# Patient Record
Sex: Female | Born: 1945 | Race: White | Hispanic: No | Marital: Married | Smoking: Never smoker
Health system: Southern US, Community
[De-identification: ages and names within clinical notes are randomized; demographics above are authoritative.]

## PROBLEM LIST (undated history)

## (undated) DIAGNOSIS — E119 Type 2 diabetes mellitus without complications: Secondary | ICD-10-CM

## (undated) DIAGNOSIS — K219 Gastro-esophageal reflux disease without esophagitis: Secondary | ICD-10-CM

## (undated) DIAGNOSIS — R609 Edema, unspecified: Secondary | ICD-10-CM

## (undated) DIAGNOSIS — M1711 Unilateral primary osteoarthritis, right knee: Secondary | ICD-10-CM

## (undated) DIAGNOSIS — M545 Low back pain: Secondary | ICD-10-CM

## (undated) DIAGNOSIS — E559 Vitamin D deficiency, unspecified: Secondary | ICD-10-CM

## (undated) DIAGNOSIS — M1990 Unspecified osteoarthritis, unspecified site: Secondary | ICD-10-CM

## (undated) DIAGNOSIS — F329 Major depressive disorder, single episode, unspecified: Secondary | ICD-10-CM

## (undated) DIAGNOSIS — E785 Hyperlipidemia, unspecified: Secondary | ICD-10-CM

## (undated) DIAGNOSIS — E0590 Thyrotoxicosis, unspecified without thyrotoxic crisis or storm: Secondary | ICD-10-CM

## (undated) DIAGNOSIS — G4700 Insomnia, unspecified: Secondary | ICD-10-CM

## (undated) DIAGNOSIS — F411 Generalized anxiety disorder: Secondary | ICD-10-CM

## (undated) DIAGNOSIS — I890 Lymphedema, not elsewhere classified: Secondary | ICD-10-CM

## (undated) DIAGNOSIS — M25569 Pain in unspecified knee: Secondary | ICD-10-CM

## (undated) DIAGNOSIS — I10 Essential (primary) hypertension: Secondary | ICD-10-CM

## (undated) DIAGNOSIS — I359 Nonrheumatic aortic valve disorder, unspecified: Secondary | ICD-10-CM

## (undated) HISTORY — DX: Morbid (severe) obesity due to excess calories: E66.01

## (undated) HISTORY — DX: Insomnia, unspecified: G47.00

## (undated) HISTORY — DX: Nonrheumatic aortic valve disorder, unspecified: I35.9

## (undated) HISTORY — DX: Gastro-esophageal reflux disease without esophagitis: K21.9

## (undated) HISTORY — DX: Low back pain: M54.5

## (undated) HISTORY — DX: Essential (primary) hypertension: I10

## (undated) HISTORY — PX: ABDOMINAL HYSTERECTOMY: SHX81

## (undated) HISTORY — DX: Lymphedema, not elsewhere classified: I89.0

## (undated) HISTORY — DX: Thyrotoxicosis, unspecified without thyrotoxic crisis or storm: E05.90

## (undated) HISTORY — DX: Major depressive disorder, single episode, unspecified: F32.9

## (undated) HISTORY — DX: Edema, unspecified: R60.9

## (undated) HISTORY — DX: Hyperlipidemia, unspecified: E78.5

## (undated) HISTORY — DX: Unilateral primary osteoarthritis, right knee: M17.11

## (undated) HISTORY — DX: Pain in unspecified knee: M25.569

## (undated) HISTORY — DX: Generalized anxiety disorder: F41.1

## (undated) HISTORY — PX: CHOLECYSTECTOMY: SHX55

## (undated) HISTORY — DX: Unspecified osteoarthritis, unspecified site: M19.90

## (undated) HISTORY — PX: OOPHORECTOMY: SHX86

## (undated) HISTORY — DX: Vitamin D deficiency, unspecified: E55.9

## (undated) HISTORY — DX: Type 2 diabetes mellitus without complications: E11.9

---

## 1999-07-08 LAB — HM MAMMOGRAPHY

## 2005-02-10 ENCOUNTER — Ambulatory Visit: Payer: Self-pay | Admitting: Internal Medicine

## 2005-02-11 ENCOUNTER — Ambulatory Visit: Payer: Self-pay | Admitting: Internal Medicine

## 2005-03-19 ENCOUNTER — Ambulatory Visit: Payer: Self-pay | Admitting: Internal Medicine

## 2005-11-09 ENCOUNTER — Ambulatory Visit: Payer: Self-pay | Admitting: Internal Medicine

## 2006-02-15 ENCOUNTER — Ambulatory Visit: Payer: Self-pay | Admitting: Internal Medicine

## 2007-03-17 ENCOUNTER — Ambulatory Visit: Payer: Self-pay | Admitting: Internal Medicine

## 2007-03-17 LAB — CONVERTED CEMR LAB
ALT: 14 units/L (ref 0–40)
AST: 18 units/L (ref 0–37)
Albumin: 3.8 g/dL (ref 3.5–5.2)
Alkaline Phosphatase: 80 units/L (ref 39–117)
BUN: 16 mg/dL (ref 6–23)
Basophils Absolute: 0.1 10*3/uL (ref 0.0–0.1)
Basophils Relative: 0.8 % (ref 0.0–1.0)
Bilirubin Urine: NEGATIVE
CO2: 30 meq/L (ref 19–32)
Chloride: 105 meq/L (ref 96–112)
Creatinine, Ser: 0.6 mg/dL (ref 0.4–1.2)
HCT: 37.6 % (ref 36.0–46.0)
HDL: 37.7 mg/dL — ABNORMAL LOW (ref >39.0)
Hemoglobin, Urine: NEGATIVE
Hemoglobin: 12.9 g/dL (ref 12.0–15.0)
Ketones, ur: 15 mg/dL — AB
LDL Cholesterol: 67 mg/dL (ref 0–99)
Leukocytes, UA: NEGATIVE
MCHC: 34.4 g/dL (ref 30.0–36.0)
Monocytes Relative: 5.8 % (ref 3.0–11.0)
RBC: 4.59 M/uL (ref 3.87–5.11)
RDW: 15.9 % — ABNORMAL HIGH (ref 11.5–14.6)
Specific Gravity, Urine: >=1.03 (ref 1.000–1.03)
TSH: 0.75 microintl units/mL (ref 0.35–5.50)
Total Bilirubin: 0.6 mg/dL (ref 0.3–1.2)
Total Protein, Urine: NEGATIVE mg/dL
Total Protein: 7.8 g/dL (ref 6.0–8.3)
Urobilinogen, UA: 2 (ref 0.0–1.0)
VLDL: 19 mg/dL (ref 0–40)
pH: 6 (ref 5.0–8.0)

## 2007-09-23 ENCOUNTER — Encounter: Payer: Self-pay | Admitting: Internal Medicine

## 2007-09-23 DIAGNOSIS — E785 Hyperlipidemia, unspecified: Secondary | ICD-10-CM

## 2007-09-23 DIAGNOSIS — I359 Nonrheumatic aortic valve disorder, unspecified: Secondary | ICD-10-CM

## 2007-09-23 DIAGNOSIS — I10 Essential (primary) hypertension: Secondary | ICD-10-CM

## 2007-09-23 DIAGNOSIS — K219 Gastro-esophageal reflux disease without esophagitis: Secondary | ICD-10-CM | POA: Insufficient documentation

## 2007-09-23 DIAGNOSIS — R609 Edema, unspecified: Secondary | ICD-10-CM | POA: Insufficient documentation

## 2007-09-23 DIAGNOSIS — F329 Major depressive disorder, single episode, unspecified: Secondary | ICD-10-CM

## 2007-09-23 DIAGNOSIS — F3289 Other specified depressive episodes: Secondary | ICD-10-CM | POA: Insufficient documentation

## 2007-09-23 DIAGNOSIS — M545 Low back pain, unspecified: Secondary | ICD-10-CM

## 2007-09-23 DIAGNOSIS — G4700 Insomnia, unspecified: Secondary | ICD-10-CM

## 2007-09-23 DIAGNOSIS — E0590 Thyrotoxicosis, unspecified without thyrotoxic crisis or storm: Secondary | ICD-10-CM

## 2007-09-23 DIAGNOSIS — F411 Generalized anxiety disorder: Secondary | ICD-10-CM | POA: Insufficient documentation

## 2007-09-23 DIAGNOSIS — E119 Type 2 diabetes mellitus without complications: Secondary | ICD-10-CM | POA: Insufficient documentation

## 2007-09-23 DIAGNOSIS — M1990 Unspecified osteoarthritis, unspecified site: Secondary | ICD-10-CM | POA: Insufficient documentation

## 2007-09-23 HISTORY — DX: Nonrheumatic aortic valve disorder, unspecified: I35.9

## 2007-09-23 HISTORY — DX: Gastro-esophageal reflux disease without esophagitis: K21.9

## 2007-09-23 HISTORY — DX: Low back pain, unspecified: M54.50

## 2007-09-23 HISTORY — DX: Insomnia, unspecified: G47.00

## 2007-09-23 HISTORY — DX: Generalized anxiety disorder: F41.1

## 2007-09-23 HISTORY — DX: Other specified depressive episodes: F32.89

## 2007-09-23 HISTORY — DX: Morbid (severe) obesity due to excess calories: E66.01

## 2007-09-23 HISTORY — DX: Edema, unspecified: R60.9

## 2007-09-23 HISTORY — DX: Hyperlipidemia, unspecified: E78.5

## 2007-09-23 HISTORY — DX: Type 2 diabetes mellitus without complications: E11.9

## 2007-09-23 HISTORY — DX: Unspecified osteoarthritis, unspecified site: M19.90

## 2007-09-23 HISTORY — DX: Major depressive disorder, single episode, unspecified: F32.9

## 2007-09-23 HISTORY — DX: Thyrotoxicosis, unspecified without thyrotoxic crisis or storm: E05.90

## 2007-09-23 HISTORY — DX: Essential (primary) hypertension: I10

## 2007-12-26 ENCOUNTER — Telehealth: Payer: Self-pay | Admitting: Internal Medicine

## 2008-03-14 ENCOUNTER — Ambulatory Visit: Payer: Self-pay | Admitting: Internal Medicine

## 2008-03-14 DIAGNOSIS — M25569 Pain in unspecified knee: Secondary | ICD-10-CM | POA: Insufficient documentation

## 2008-03-14 HISTORY — DX: Pain in unspecified knee: M25.569

## 2008-03-14 LAB — CONVERTED CEMR LAB
Albumin: 3.6 g/dL (ref 3.5–5.2)
BUN: 15 mg/dL (ref 6–23)
Basophils Relative: 0 % (ref 0.0–1.0)
Cholesterol: 139 mg/dL (ref 0–200)
Creatinine, Ser: 0.6 mg/dL (ref 0.4–1.2)
Creatinine,U: 58.4 mg/dL
Eosinophils Absolute: 0.1 10*3/uL (ref 0.0–0.7)
Eosinophils Relative: 1.2 % (ref 0.0–5.0)
GFR calc Af Amer: 131 mL/min
GFR calc non Af Amer: 108 mL/min
HCT: 37.1 % (ref 36.0–46.0)
HDL: 40.1 mg/dL (ref >39.0)
Hemoglobin, Urine: NEGATIVE
Hgb A1c MFr Bld: 6.6 % — ABNORMAL HIGH (ref 4.6–6.0)
Ketones, ur: NEGATIVE mg/dL
MCHC: 33.7 g/dL (ref 30.0–36.0)
MCV: 82.2 fL (ref 78.0–100.0)
Microalb Creat Ratio: 80.5 mg/g — ABNORMAL HIGH (ref 0.0–30.0)
Monocytes Absolute: 0.3 10*3/uL (ref 0.1–1.0)
Platelets: 184 10*3/uL (ref 150–400)
Potassium: 4.2 meq/L (ref 3.5–5.1)
Total Protein, Urine: NEGATIVE mg/dL
Triglycerides: 105 mg/dL (ref 0–149)
Urine Glucose: NEGATIVE mg/dL
VLDL: 21 mg/dL (ref 0–40)
WBC: 6.6 10*3/uL (ref 4.5–10.5)

## 2008-04-19 ENCOUNTER — Telehealth (INDEPENDENT_AMBULATORY_CARE_PROVIDER_SITE_OTHER): Payer: Self-pay | Admitting: *Deleted

## 2008-05-08 ENCOUNTER — Encounter: Payer: Self-pay | Admitting: Internal Medicine

## 2008-08-09 ENCOUNTER — Telehealth: Payer: Self-pay | Admitting: Internal Medicine

## 2008-12-03 ENCOUNTER — Encounter: Admission: RE | Admit: 2008-12-03 | Discharge: 2008-12-03 | Payer: Self-pay | Admitting: Orthopedic Surgery

## 2009-03-18 ENCOUNTER — Ambulatory Visit: Payer: Self-pay | Admitting: Internal Medicine

## 2009-03-19 LAB — CONVERTED CEMR LAB
ALT: 16 units/L (ref 0–35)
AST: 22 units/L (ref 0–37)
BUN: 15 mg/dL (ref 6–23)
Bilirubin Urine: NEGATIVE
Bilirubin, Direct: 0.1 mg/dL (ref 0.0–0.3)
Creatinine, Ser: 0.6 mg/dL (ref 0.4–1.2)
Eosinophils Relative: 1.5 % (ref 0.0–5.0)
GFR calc non Af Amer: 107.41 mL/min (ref >60)
Hemoglobin, Urine: NEGATIVE
Ketones, ur: NEGATIVE mg/dL
LDL Cholesterol: 79 mg/dL (ref 0–99)
Microalb, Ur: 1.3 mg/dL (ref 0.0–1.9)
Monocytes Absolute: 0.4 10*3/uL (ref 0.1–1.0)
Monocytes Relative: 5.6 % (ref 3.0–12.0)
Neutrophils Relative %: 74.9 % (ref 43.0–77.0)
Platelets: 162 10*3/uL (ref 150.0–400.0)
Total Bilirubin: 0.5 mg/dL (ref 0.3–1.2)
Total CHOL/HDL Ratio: 4
Triglycerides: 157 mg/dL — ABNORMAL HIGH (ref 0.0–149.0)
Urine Glucose: NEGATIVE mg/dL
Urobilinogen, UA: 0.2 (ref 0.0–1.0)
VLDL: 31.4 mg/dL (ref 0.0–40.0)
WBC: 6.3 10*3/uL (ref 4.5–10.5)

## 2009-03-20 LAB — CONVERTED CEMR LAB: Vit D, 25-Hydroxy: 5 ng/mL — ABNORMAL LOW (ref 30–89)

## 2009-03-27 ENCOUNTER — Telehealth (INDEPENDENT_AMBULATORY_CARE_PROVIDER_SITE_OTHER): Payer: Self-pay | Admitting: *Deleted

## 2009-11-03 ENCOUNTER — Telehealth: Payer: Self-pay | Admitting: Internal Medicine

## 2009-11-07 ENCOUNTER — Telehealth: Payer: Self-pay | Admitting: Internal Medicine

## 2009-12-30 ENCOUNTER — Encounter: Payer: Self-pay | Admitting: Internal Medicine

## 2010-03-04 ENCOUNTER — Telehealth: Payer: Self-pay | Admitting: Internal Medicine

## 2010-03-31 ENCOUNTER — Ambulatory Visit: Payer: Self-pay | Admitting: Internal Medicine

## 2010-03-31 DIAGNOSIS — E559 Vitamin D deficiency, unspecified: Secondary | ICD-10-CM

## 2010-03-31 HISTORY — DX: Vitamin D deficiency, unspecified: E55.9

## 2010-03-31 LAB — CONVERTED CEMR LAB
ALT: 14 units/L (ref 0–35)
Albumin: 3.5 g/dL (ref 3.5–5.2)
Basophils Relative: 0.2 % (ref 0.0–3.0)
CO2: 29 meq/L (ref 19–32)
Chloride: 105 meq/L (ref 96–112)
Creatinine,U: 46.1 mg/dL
Eosinophils Absolute: 0.1 10*3/uL (ref 0.0–0.7)
Eosinophils Relative: 0.8 % (ref 0.0–5.0)
HCT: 34.6 % — ABNORMAL LOW (ref 36.0–46.0)
HDL: 37.7 mg/dL — ABNORMAL LOW (ref >39.00)
Hemoglobin: 11.7 g/dL — ABNORMAL LOW (ref 12.0–15.0)
Hgb A1c MFr Bld: 7.1 % — ABNORMAL HIGH (ref 4.6–6.5)
Ketones, ur: NEGATIVE mg/dL
Leukocytes, UA: NEGATIVE
MCHC: 33.7 g/dL (ref 30.0–36.0)
MCV: 83.9 fL (ref 78.0–100.0)
Microalb, Ur: 4.4 mg/dL — ABNORMAL HIGH (ref 0.0–1.9)
Monocytes Absolute: 0.4 10*3/uL (ref 0.1–1.0)
Neutro Abs: 6 10*3/uL (ref 1.4–7.7)
Potassium: 4.4 meq/L (ref 3.5–5.1)
RBC: 4.13 M/uL (ref 3.87–5.11)
Sodium: 143 meq/L (ref 135–145)
Specific Gravity, Urine: 1.015 (ref 1.000–1.030)
TSH: 0.95 microintl units/mL (ref 0.35–5.50)
Total Protein: 7.1 g/dL (ref 6.0–8.3)
Triglycerides: 79 mg/dL (ref 0.0–149.0)
Urine Glucose: NEGATIVE mg/dL
Urobilinogen, UA: 0.2 (ref 0.0–1.0)
pH: 6.5 (ref 5.0–8.0)

## 2010-09-22 ENCOUNTER — Encounter: Payer: Self-pay | Admitting: Internal Medicine

## 2010-09-25 ENCOUNTER — Ambulatory Visit: Payer: Self-pay | Admitting: Internal Medicine

## 2010-09-30 ENCOUNTER — Encounter: Payer: Self-pay | Admitting: Internal Medicine

## 2010-10-12 ENCOUNTER — Encounter: Payer: Self-pay | Admitting: Internal Medicine

## 2010-10-13 ENCOUNTER — Encounter: Payer: Self-pay | Admitting: Internal Medicine

## 2010-10-14 ENCOUNTER — Encounter: Payer: Self-pay | Admitting: Internal Medicine

## 2010-10-19 ENCOUNTER — Encounter: Payer: Self-pay | Admitting: Internal Medicine

## 2010-10-20 ENCOUNTER — Encounter: Payer: Self-pay | Admitting: Internal Medicine

## 2010-10-27 ENCOUNTER — Telehealth: Payer: Self-pay | Admitting: Internal Medicine

## 2010-10-29 ENCOUNTER — Encounter: Payer: Self-pay | Admitting: Internal Medicine

## 2010-11-04 ENCOUNTER — Telehealth: Payer: Self-pay | Admitting: Internal Medicine

## 2010-11-04 ENCOUNTER — Ambulatory Visit: Payer: Self-pay | Admitting: Internal Medicine

## 2010-11-05 LAB — CONVERTED CEMR LAB
BUN: 27 mg/dL — ABNORMAL HIGH (ref 6–23)
CO2: 30 meq/L (ref 19–32)
Calcium: 9.3 mg/dL (ref 8.4–10.5)
Creatinine, Ser: 1.3 mg/dL — ABNORMAL HIGH (ref 0.4–1.2)
GFR calc non Af Amer: 42.64 mL/min — ABNORMAL LOW (ref >60.00)
Glucose, Bld: 179 mg/dL — ABNORMAL HIGH (ref 70–99)
LDL Cholesterol: 80 mg/dL (ref 0–99)
Sodium: 140 meq/L (ref 135–145)
Total CHOL/HDL Ratio: 5
Triglycerides: 126 mg/dL (ref 0.0–149.0)

## 2010-12-13 ENCOUNTER — Encounter: Payer: Self-pay | Admitting: Orthopedic Surgery

## 2010-12-22 ENCOUNTER — Encounter: Payer: Self-pay | Admitting: Internal Medicine

## 2010-12-22 DIAGNOSIS — E119 Type 2 diabetes mellitus without complications: Secondary | ICD-10-CM

## 2010-12-22 DIAGNOSIS — M1710 Unilateral primary osteoarthritis, unspecified knee: Secondary | ICD-10-CM

## 2010-12-22 DIAGNOSIS — I10 Essential (primary) hypertension: Secondary | ICD-10-CM

## 2010-12-23 ENCOUNTER — Telehealth: Payer: Self-pay | Admitting: Internal Medicine

## 2010-12-24 NOTE — Letter (Signed)
Summary: [facility]   Imported By: Lester [place] 11/06/2010 10:34:30  _____________________________________________________________________  External Attachment:    Type:   Image     Comment:   External Document

## 2010-12-24 NOTE — Letter (Signed)
Summary: Discharge Medications/[facility]  Discharge Medications/[facility]   Imported By: Sherian Rein 12/18/2010 11:12:54  _____________________________________________________________________  External Attachment:    Type:   Image     Comment:   External Document

## 2010-12-24 NOTE — Progress Notes (Signed)
  Phone Note Refill Request  on March 04, 2010 1:20 PM  Refills Requested: Medication #1:  CRESTOR 40 MG  TABS 1/2 by mouth qd   Dosage confirmed as above?Dosage Confirmed   Notes: [facility] [address] Initial call taken by: Scharlene Gloss,  March 04, 2010 1:20 PM    Prescriptions: CRESTOR 40 MG  TABS (ROSUVASTATIN CALCIUM) 1/2 by mouth qd  #45 x 0   Entered by:   Scharlene Gloss   Authorized by:   Corwin Levins MD   Signed by:   Scharlene Gloss on 03/04/2010   Method used:   Faxed to ...       [facility]  [address] # [id number]* (retail)       [address]       [place], [place]  [address]       Ph: [phone] or [phone]       Fax: [phone]   RxID:   [id number]

## 2010-12-24 NOTE — Progress Notes (Signed)
Summary: pneumonia shot  Phone Note Call from Patient Call back at Home Phone [phone]   Caller: Patient Call For: Corwin Levins MD Reason for Call: Talk to Doctor Summary of Call: Pt left vm stating currently in hosp @ [facility]. Need to know when was here last pneumonia shot? Pls advise Initial call taken by: Orlan Leavens RMA,  October 27, 2010 8:59 AM  Follow-up for Phone Call        Per EMR last pnumonia shotwas given 03/18/09. Called pt no ansew LMOM Follow-up by: Orlan Leavens RMA,  October 27, 2010 9:16 AM

## 2010-12-24 NOTE — Letter (Signed)
Summary: CMN/[facility]  CMN/[facility]   Imported By: Lester [place] 10/19/2010 07:22:43  _____________________________________________________________________  External Attachment:    Type:   Image     Comment:   External Document

## 2010-12-24 NOTE — Letter (Signed)
Summary: [facility]  [facility]   Imported By: Lennie Odor 12/17/2010 09:53:41  _____________________________________________________________________  External Attachment:    Type:   Image     Comment:   External Document

## 2010-12-24 NOTE — Letter (Signed)
Summary: [facility]   Imported By: Sherian Rein 10/29/2010 09:46:23  _____________________________________________________________________  External Attachment:    Type:   Image     Comment:   External Document

## 2010-12-24 NOTE — Assessment & Plan Note (Signed)
Summary: post hosp/open wound following surgery/med consult/lb   Vital Signs:  Patient profile:   65 year old female Height:      63 inches Weight:      280 pounds BMI:     49.78 O2 Sat:      95 % on Room air Temp:     99 degrees F oral Pulse rate:   94 / minute BP sitting:   108 / 62  (left arm)  Vitals Entered By: Zella Ball Ewing CMA Duncan Dull) (November 04, 2010 2:20 PM)  O2 Flow:  Room air CC: Post Hospital/RE   CC:  Post Hospital/RE.  History of Present Illness: here to f/u post hosp d/c for mass removed from right upper leg unfortunately complicated by skin infection and repeat hospn, then several wk stay at rehab;  during this time her actos, nsaid, and tramadol d/c'd;  currently doing ok on oxycodone and still have appro 2 wks med left;  still with chronic right knee pain to which pt states she got by OK with the tramadol but could use 2 at at time instead of one since she is no longer taking the nsaid and not likely to be able to get back to her ortho soon for coritsone so soon post op as she had been doing about every 3 mo prior;  on HCTZ now instead of lasix and doing ok with stable trace edeam at best to the distal legs;  has had medicare < 1 yr and remembers also she needs new glucometer and rx for supplies that medicare allows and has not been checking sugars since being at home post rehab in the past wk;  course also complicated by depression , now states doing better on the lexapro. Sleeping ok with the zolpidem.   Does also have significant bad breath and asks for magic mouthwash trial    Problems Prior to Update: 1)  Vitamin D Deficiency  (ICD-268.9) 2)  Preventive Health Care  (ICD-V70.0) 3)  Knee Pain, Right, Chronic  (ICD-719.46) 4)  Preventive Health Care  (ICD-V70.0) 5)  Depression  (ICD-311) 6)  Peripheral Edema  (ICD-782.3) 7)  Insomnia  (ICD-780.52) 8)  Low Back Pain  (ICD-724.2) 9)  Diabetes Mellitus, Type II  (ICD-250.00) 10)  Anxiety  (ICD-300.00) 11)  Aortic  Insufficiency  (ICD-424.1) 12)  Osteoarthritis  (ICD-715.90) 13)  Morbid Obesity  (ICD-278.01) 14)  Hypertension  (ICD-401.9) 15)  Hyperthyroidism  (ICD-242.90) 16)  Hyperlipidemia  (ICD-272.4) 17)  Gerd  (ICD-530.81)  Medications Prior to Update: 1)  Actos 30 Mg Tabs (Pioglitazone Hcl) .Marland Kitchen.. 1 By Mouth Once Daily 2)  Amlodipine Besylate 10 Mg  Tabs (Amlodipine Besylate) .Marland Kitchen.. 1 By Mouth Once Daily 3)  Levothyroxine Sodium 100 Mcg Tabs (Levothyroxine Sodium) .Marland Kitchen.. 1 By Mouth Once Daily 4)  Tramadol Hcl 50 Mg Tabs (Tramadol Hcl) .Marland Kitchen.. 1 By Mouth Four Times Per Day As Needed For Pain 5)  Lasix 40 Mg Tabs (Furosemide) .... Take 1 Tablet By Mouth Twice A Day 6)  Alprazolam 0.5 Mg  Tabs (Alprazolam) .Marland Kitchen.. 1 By Mouth Once Daily As Needed 7)  Benazepril Hcl 40 Mg  Tabs (Benazepril Hcl) .Marland Kitchen.. 1 By Mouth Once Daily 8)  Omeprazole 20 Mg  Cpdr (Omeprazole) .Marland Kitchen.. 1 By Mouth Once Daily 9)  Crestor 20 Mg Tabs (Rosuvastatin Calcium) .Marland Kitchen.. 1 By Mouth Once Daily 10)  Diclofenac Sodium 75 Mg  Tbec (Diclofenac Sodium) .Marland Kitchen.. 1 By Mouth Two Times A Day As Needed 11)  Ecotrin Low  Strength 81 Mg  Tbec (Aspirin) .Marland Kitchen.. 1 By Mouth Once Daily  Current Medications (verified): 1)  Amlodipine Besylate 10 Mg  Tabs (Amlodipine Besylate) .Marland Kitchen.. 1 By Mouth Once Daily 2)  Levothyroxine Sodium 100 Mcg Tabs (Levothyroxine Sodium) .Marland Kitchen.. 1 By Mouth Once Daily 3)  Tramadol Hcl 50 Mg Tabs (Tramadol Hcl) .Marland Kitchen.. 1 By Mouth Four Times Per Day As Needed For Pain 4)  Alprazolam 0.5 Mg  Tabs (Alprazolam) .Marland Kitchen.. 1 By Mouth Once Daily As Needed 5)  Benazepril Hcl 40 Mg  Tabs (Benazepril Hcl) .Marland Kitchen.. 1 By Mouth Once Daily 6)  Omeprazole 20 Mg  Cpdr (Omeprazole) .Marland Kitchen.. 1 By Mouth Once Daily 7)  Crestor 20 Mg Tabs (Rosuvastatin Calcium) .Marland Kitchen.. 1 By Mouth Once Daily 8)  Ecotrin Low Strength 81 Mg  Tbec (Aspirin) .Marland Kitchen.. 1 By Mouth Once Daily 9)  Hydrochlorothiazide 25 Mg Tabs (Hydrochlorothiazide) .Marland Kitchen.. 1 By Mouth Once Daily 10)  Oxycodone Hcl 5 Mg Tabs (Oxycodone  Hcl) .Marland Kitchen.. 1 By Mouth Every 3 Hours As Needed 11)  Lidoderm 5 % Ptch (Lidocaine) .Marland Kitchen.. 1 Patch Each Knee Once Daily 12)  Colace 100 Mg Caps (Docusate Sodium) .Marland Kitchen.. 1 By Mouth Two Times A Day 13)  Zolpidem Tartrate 5 Mg Tabs (Zolpidem Tartrate) .Marland Kitchen.. 1 By Mouth At Bedtime 14)  Lexapro 10 Mg Tabs (Escitalopram Oxalate) .Marland Kitchen.. 1 By Mouth Once Daily 15)  Magic Mouthwash .... 5 Cc By Mouth Q 6 Hrs As Needed 16)  Onetouch Test  Strp (Glucose Blood) .... Use Asd 1 Once Daily 17)  Lancets  Misc (Lancets) .... Use Asd 1 Once Daily 18)  Actos 15 Mg Tabs (Pioglitazone Hcl) .Marland Kitchen.. 1po Once Daily 19)  Zantac 150 Mg Tabs (Ranitidine Hcl) .Marland Kitchen.. 1po Once Daily As Needed  Allergies (verified): No Known Drug Allergies  Past History:  Past Surgical History: Last updated: 09/23/2007 Hysterectomy Oophorectomy Cholecystectomy  Social History: Last updated: 03/31/2010 Never Smoked Alcohol use-yes Drug use-no  Risk Factors: Smoking Status: never (09/27/2007)  Past Medical History: GERD Hyperlipidemia Hyperthyroidism Hypertension Morbid Obesity Osteoarthritis- Knee Aortic Insufficiency Anxiety Diabetes mellitus, type II Low back pain, recurrent Insomnia Peripheral Edema Depression vit d deficiency  Review of Systems       all otherwise negative per pt -    Physical Exam  General:  alert and overweight-appearing.   Head:  normocephalic and atraumatic.   Eyes:  vision grossly intact, pupils equal, and pupils round.   Ears:  R ear normal and L ear normal.   Nose:  nose piercing noted and no nasal discharge.   Mouth:  no gingival abnormalities and pharynx pink and moist.   Neck:  supple and no masses.   Lungs:  normal respiratory effort and normal breath sounds.   Heart:  normal rate and regular rhythm.   Abdomen:  soft, non-tender, and normal bowel sounds.   Msk:  no joint tenderness and no joint swelling.  except for mod right knee effusion and crepitus , nontender Extremities:  trace  bilat edema, no ulcers or erythema   Impression & Recommendations:  Problem # 1:  KNEE PAIN, RIGHT, CHRONIC (ICD-719.46)  The following medications were removed from the medication list:    Diclofenac Sodium 75 Mg Tbec (Diclofenac sodium) .Marland Kitchen... 1 by mouth two times a day as needed Her updated medication list for this problem includes:    Tramadol Hcl 50 Mg Tabs (Tramadol hcl) .Marland Kitchen... 1 by mouth four times per day as needed for pain    Ecotrin Low  Strength 81 Mg Tbec (Aspirin) .Marland Kitchen... 1 by mouth once daily    Oxycodone Hcl 5 Mg Tabs (Oxycodone hcl) .Marland Kitchen... 1 by mouth every 3 hours as needed treat as above, f/u any worsening signs or symptoms , to transition to tramadol after current oxycodone finished, f/u ortho as needed   Problem # 2:  PERIPHERAL EDEMA (ICD-782.3)  The following medications were removed from the medication list:    Lasix 40 Mg Tabs (Furosemide) .Marland Kitchen... Take 1 tablet by mouth twice a day Her updated medication list for this problem includes:    Hydrochlorothiazide 25 Mg Tabs (Hydrochlorothiazide) .Marland Kitchen... 1 by mouth once daily  Discussed elevation of the legs, use of compression stockings, sodium restiction, and medication use.  treat as above, f/u any worsening signs or symptoms   Problem # 3:  HYPERTENSION (ICD-401.9)  The following medications were removed from the medication list:    Lasix 40 Mg Tabs (Furosemide) .Marland Kitchen... Take 1 tablet by mouth twice a day Her updated medication list for this problem includes:    Amlodipine Besylate 10 Mg Tabs (Amlodipine besylate) .Marland Kitchen... 1 by mouth once daily    Benazepril Hcl 40 Mg Tabs (Benazepril hcl) .Marland Kitchen... 1 by mouth once daily    Hydrochlorothiazide 25 Mg Tabs (Hydrochlorothiazide) .Marland Kitchen... 1 by mouth once daily  BP today: 108/62 Prior BP: 142/84 (03/31/2010)  Labs Reviewed: K+: 4.4 (03/31/2010) Creat: : 0.5 (03/31/2010)   Chol: 109 (03/31/2010)   HDL: 37.70 (03/31/2010)   LDL: 56 (03/31/2010)   TG: 79.0 (03/31/2010) stable overall by  hx and exam, ok to continue meds/tx as is   Problem # 4:  DIABETES MELLITUS, TYPE II (ICD-250.00)  The following medications were removed from the medication list:    Actos 30 Mg Tabs (Pioglitazone hcl) .Marland Kitchen... 1 by mouth once daily Her updated medication list for this problem includes:    Benazepril Hcl 40 Mg Tabs (Benazepril hcl) .Marland Kitchen... 1 by mouth once daily    Ecotrin Low Strength 81 Mg Tbec (Aspirin) .Marland Kitchen... 1 by mouth once daily    Actos 15 Mg Tabs (Pioglitazone hcl) .Marland Kitchen... 1po once daily  Orders: TLB-BMP (Basic Metabolic Panel-BMET) (80048-METABOL) TLB-A1C / Hgb A1C (Glycohemoglobin) (83036-A1C) TLB-Lipid Panel (80061-LIPID) stable overall by hx and exam, ok to continue meds/tx as is , Pt to cont DM diet, excercise, wt control efforts; to check labs today; consider re-start the actos  Complete Medication List: 1)  Amlodipine Besylate 10 Mg Tabs (Amlodipine besylate) .Marland Kitchen.. 1 by mouth once daily 2)  Levothyroxine Sodium 100 Mcg Tabs (Levothyroxine sodium) .Marland Kitchen.. 1 by mouth once daily 3)  Tramadol Hcl 50 Mg Tabs (Tramadol hcl) .Marland Kitchen.. 1 by mouth four times per day as needed for pain 4)  Alprazolam 0.5 Mg Tabs (Alprazolam) .Marland Kitchen.. 1 by mouth once daily as needed 5)  Benazepril Hcl 40 Mg Tabs (Benazepril hcl) .Marland Kitchen.. 1 by mouth once daily 6)  Omeprazole 20 Mg Cpdr (Omeprazole) .Marland Kitchen.. 1 by mouth once daily 7)  Crestor 20 Mg Tabs (Rosuvastatin calcium) .Marland Kitchen.. 1 by mouth once daily 8)  Ecotrin Low Strength 81 Mg Tbec (Aspirin) .Marland Kitchen.. 1 by mouth once daily 9)  Hydrochlorothiazide 25 Mg Tabs (Hydrochlorothiazide) .Marland Kitchen.. 1 by mouth once daily 10)  Oxycodone Hcl 5 Mg Tabs (Oxycodone hcl) .Marland Kitchen.. 1 by mouth every 3 hours as needed 11)  Lidoderm 5 % Ptch (Lidocaine) .Marland Kitchen.. 1 patch each knee once daily 12)  Colace 100 Mg Caps (Docusate sodium) .Marland Kitchen.. 1 by mouth two times a day 13)  Zolpidem Tartrate 5 Mg Tabs (Zolpidem tartrate) .Marland Kitchen.. 1 by mouth at bedtime 14)  Lexapro 10 Mg Tabs (Escitalopram oxalate) .Marland Kitchen.. 1 by mouth once  daily 15)  Magic Mouthwash  .... 5 cc by mouth q 6 hrs as needed 16)  Onetouch Test Strp (Glucose blood) .... Use asd 1 once daily 17)  Lancets Misc (Lancets) .... Use asd 1 once daily 18)  Actos 15 Mg Tabs (Pioglitazone hcl) .Marland Kitchen.. 1po once daily 19)  Zantac 150 Mg Tabs (Ranitidine hcl) .Marland Kitchen.. 1po once daily as needed  Patient Instructions: 1)  Please take all new medications as prescribed - the tramadol for after the oxycodone is out 2)  Please go to the Lab in the basement for your blood and/or urine tests today 3)  Please call the number on the [facility] Card for results of your testing  4)  Please call in 3-5 days after checking your blood sugars twice per day, so that we can decide how much actos you will need 5)  Please schedule a follow-up appointment in 3 months. Prescriptions: LANCETS  MISC (LANCETS) use asd 1 once daily  #100 x 11   Entered and Authorized by:   Corwin Levins MD   Signed by:   Corwin Levins MD on 11/04/2010   Method used:   Electronically to        [facility] [address] #[id number]* (retail)       [address] [place], [place]  [address]       Ph: [phone]       Fax: [phone]   RxID:   [id number] [facility] TEST  STRP (GLUCOSE BLOOD) use asd 1 once daily  #100 x 11   Entered and Authorized by:   Corwin Levins MD   Signed by:   Corwin Levins MD on 11/04/2010   Method used:   Electronically to        [facility] [address] #[id number]* (retail)       [address] [place], [place]  [address]       Ph: [phone]       Fax: [phone]   RxID:   [id number] TRAMADOL HCL 50 MG TABS (TRAMADOL HCL) 1 by mouth four times per day as needed for pain  #240 x 2   Entered and Authorized by:   Corwin Levins MD   Signed by:   Corwin Levins MD on 11/04/2010   Method used:   Electronically to        [facility] [address] #[id number]* (retail)       [address] [place], [place]  [address]       Ph: [phone]       Fax: [phone]   RxID:    [id number] MAGIC MOUTHWASH 5 cc by mouth q 6 hrs as needed  #1large bottl x 1   Entered and Authorized by:   Corwin Levins MD   Signed by:   Corwin Levins MD on 11/04/2010   Method used:   Print then Give to Patient   RxID:   [id number]    Orders Added: 1)  TLB-BMP (Basic Metabolic Panel-BMET) [80048-METABOL] 2)  TLB-A1C / Hgb A1C (Glycohemoglobin) [83036-A1C] 3)  TLB-Lipid Panel [80061-LIPID] 4)  Est. Patient Level IV [18299]

## 2010-12-24 NOTE — Letter (Signed)
[facility]   Imported By: Lennie Odor 11/06/2010 14:14:19  _____________________________________________________________________  External Attachment:    Type:   Image     Comment:   External Document

## 2010-12-24 NOTE — Letter (Signed)
Summary: Patient Discharge Instructions / [facility]  Patient Discharge Instructions / [facility]   Imported By: Lennie Odor 10/27/2010 13:59:14  _____________________________________________________________________  External Attachment:    Type:   Image     Comment:   External Document

## 2010-12-24 NOTE — Assessment & Plan Note (Signed)
Summary: MED REFILL FU--STC   Vital Signs:  Patient profile:   65 year old female Height:      62 inches Weight:      270 pounds BMI:     49.56 O2 Sat:      94 % on Room air Temp:     98 degrees F oral Pulse rate:   88 / minute BP sitting:   142 / 84  (left arm) Cuff size:   large  Vitals Entered ByZella Ball Ewing (Mar 31, 2010 2:34 PM)  O2 Flow:  Room air  Preventive Care Screening     decliens colonoscopy or mammogram  CC: Medication Refill/RE   CC:  Medication Refill/RE.  History of Present Illness: here for yearly f/u;  overall doing well; Pt denies CP, sob, doe, wheezing, orthopnea, pnd, worsening LE edema, palps, dizziness or syncope  Pt denies new neuro symptoms such as headache, facial or extremity weakness  Pt denies polydipsia, polyuria, or low sugar symptoms such as shakiness improved with eating.  Overall good compliance with meds, trying to follow low chol, DM diet, wt stable, little excercise however   No worsening MSK complaints;  pain overall stable.  No recent falls.  Walks with cane today, has 2 walkers at home.  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    Preventive Screening-Counseling & Management      Drug Use:  no.    Problems Prior to Update: 1)  Vitamin D Deficiency  (ICD-268.9) 2)  Preventive Health Care  (ICD-V70.0) 3)  Knee Pain, Right, Chronic  (ICD-719.46) 4)  Preventive Health Care  (ICD-V70.0) 5)  Depression  (ICD-311) 6)  Peripheral Edema  (ICD-782.3) 7)  Insomnia  (ICD-780.52) 8)  Low Back Pain  (ICD-724.2) 9)  Diabetes Mellitus, Type II  (ICD-250.00) 10)  Anxiety  (ICD-300.00) 11)  Aortic Insufficiency  (ICD-424.1) 12)  Osteoarthritis  (ICD-715.90) 13)  Morbid Obesity  (ICD-278.01) 14)  Hypertension  (ICD-401.9) 15)  Hyperthyroidism  (ICD-242.90) 16)  Hyperlipidemia  (ICD-272.4) 17)  Gerd  (ICD-530.81)  Medications  Prior to Update: 1)  Actos 30 Mg Tabs (Pioglitazone Hcl) .Marland Kitchen.. 1 By Mouth Qd 2)  Amlodipine Besylate 10 Mg  Tabs (Amlodipine Besylate) .Marland Kitchen.. 1 By Mouth Qd 3)  Levothyroxine Sodium 100 Mcg Tabs (Levothyroxine Sodium) .Marland Kitchen.. 1 By Mouth Once Daily 4)  Tramadol Hcl 50 Mg Tabs (Tramadol Hcl) .Marland Kitchen.. 1 By Mouth Four Times Per Day As Needed For Pain 5)  Lasix 40 Mg Tabs (Furosemide) .... Take 1 Tablet By Mouth Twice A Day 6)  Alprazolam 0.5 Mg  Tabs (Alprazolam) .Marland Kitchen.. 1 By Mouth Once Daily As Needed 7)  Citalopram Hydrobromide 20 Mg  Tabs (Citalopram Hydrobromide) .Marland Kitchen.. 1 By Mouth Qd 8)  Benazepril Hcl 40 Mg  Tabs (Benazepril Hcl) .Marland Kitchen.. 1 By Mouth Qd 9)  Omeprazole 20 Mg  Cpdr (Omeprazole) .Marland Kitchen.. 1p O Qd 10)  Crestor 40 Mg  Tabs (Rosuvastatin Calcium) .... 1/2 By Mouth Qd 11)  Diclofenac Sodium 75 Mg  Tbec (Diclofenac Sodium) .Marland Kitchen.. 1 By Mouth Two Times A Day Prn 12)  Ecotrin Low Strength 81 Mg  Tbec (Aspirin) .Marland Kitchen.. 1 By Mouth Once Daily  Current Medications (verified): 1)  Actos 30 Mg Tabs (Pioglitazone Hcl) .Marland Kitchen.. 1 By Mouth Once Daily 2)  Amlodipine Besylate 10 Mg  Tabs (Amlodipine Besylate) .Marland Kitchen.. 1 By Mouth Once  Daily 3)  Levothyroxine Sodium 100 Mcg Tabs (Levothyroxine Sodium) .Marland Kitchen.. 1 By Mouth Once Daily 4)  Tramadol Hcl 50 Mg Tabs (Tramadol Hcl) .Marland Kitchen.. 1 By Mouth Four Times Per Day As Needed For Pain 5)  Lasix 40 Mg Tabs (Furosemide) .... Take 1 Tablet By Mouth Twice A Day 6)  Alprazolam 0.5 Mg  Tabs (Alprazolam) .Marland Kitchen.. 1 By Mouth Once Daily As Needed 7)  Benazepril Hcl 40 Mg  Tabs (Benazepril Hcl) .Marland Kitchen.. 1 By Mouth Once Daily 8)  Omeprazole 20 Mg  Cpdr (Omeprazole) .Marland Kitchen.. 1 By Mouth Once Daily 9)  Crestor 20 Mg Tabs (Rosuvastatin Calcium) .Marland Kitchen.. 1 By Mouth Once Daily 10)  Diclofenac Sodium 75 Mg  Tbec (Diclofenac Sodium) .Marland Kitchen.. 1 By Mouth Two Times A Day As Needed 11)  Ecotrin Low Strength 81 Mg  Tbec (Aspirin) .Marland Kitchen.. 1 By Mouth Once Daily  Allergies (verified): No Known Drug Allergies  Past History:  Family  History: Last updated: 03/14/2008 HTN  Social History: Last updated: 03/31/2010 Never Smoked Alcohol use-yes Drug use-no  Risk Factors: Smoking Status: never (09/27/2007)  Past Medical History: GERD Hyperlipidemia Hyperthyroidism Hypertension Morbid Obesity Osteoarthritis- Knee Aortic Insufficiency Anxiety Diabetes mellitus, type II Low back pain, recurrent Insomnia Peripheral Edema Depression vit d deficiency  Past Surgical History: Reviewed history from 09/23/2007 and no changes required. Hysterectomy Oophorectomy Cholecystectomy  Family History: Reviewed history from 03/14/2008 and no changes required. HTN  Social History: Reviewed history from 09/27/2007 and no changes required. Never Smoked Alcohol use-yes Drug use-no Drug Use:  no  Review of Systems  The patient denies anorexia, fever, weight loss, vision loss, decreased hearing, hoarseness, chest pain, syncope, dyspnea on exertion, peripheral edema, prolonged cough, headaches, hemoptysis, abdominal pain, melena, hematochezia, severe indigestion/heartburn, hematuria, muscle weakness, suspicious skin lesions, transient blindness, difficulty walking, depression, unusual weight change, abnormal bleeding, enlarged lymph nodes, and angioedema.         all otherwise negative per pt -    Physical Exam  General:  alert and overweight-appearing.   Head:  normocephalic and atraumatic.   Eyes:  vision grossly intact, pupils equal, and pupils round.   Ears:  R ear normal and L ear normal.   Nose:  nose piercing noted and no nasal discharge.   Mouth:  no gingival abnormalities and pharynx pink and moist.   Neck:  supple and no masses.   Lungs:  normal respiratory effort and normal breath sounds.   Heart:  normal rate and regular rhythm.   Abdomen:  soft, non-tender, and normal bowel sounds.   Msk:  no joint tenderness and no joint swelling.  except for mod right knee effusion Extremities:  no edema, no  erythema  Neurologic:  cranial nerves II-XII intact and strength normal in all extremities.   Skin:  color normal and no rashes.   Psych:  not anxious appearing and not depressed appearing.     Impression & Recommendations:  Problem # 1:  Preventive Health Care (ICD-V70.0)  Overall doing well, age appropriate education and counseling updated and referral for appropriate preventive services done unless declined, immunizations up to date or declined, diet counseling done if overweight, urged to quit smoking if smokes , most recent labs reviewed and current ordered if appropriate, ecg reviewed or declined (interpretation per ECG scanned in the EMR if done); information regarding Medicare Prevention requirements given if appropriate; speciality referrals updated as appropriate   Orders: T-Vitamin D (25-Hydroxy) (16109-60454) TLB-BMP (Basic Metabolic Panel-BMET) (80048-METABOL) TLB-CBC Platelet - w/Differential (85025-CBCD) TLB-Hepatic/Liver  Function Pnl (80076-HEPATIC) TLB-Lipid Panel (80061-LIPID) TLB-TSH (Thyroid Stimulating Hormone) (84443-TSH) TLB-Udip ONLY (81003-UDIP)  Problem # 2:  DIABETES MELLITUS, TYPE II (ICD-250.00)  Her updated medication list for this problem includes:    Actos 30 Mg Tabs (Pioglitazone hcl) .Marland Kitchen... 1 by mouth once daily    Benazepril Hcl 40 Mg Tabs (Benazepril hcl) .Marland Kitchen... 1 by mouth once daily    Ecotrin Low Strength 81 Mg Tbec (Aspirin) .Marland Kitchen... 1 by mouth once daily  Labs Reviewed: Creat: 0.6 (03/18/2009)    Reviewed HgBA1c results: 6.6 (03/18/2009)  6.6 (03/14/2008)  Orders: TLB-A1C / Hgb A1C (Glycohemoglobin) (83036-A1C) TLB-Microalbumin/Creat Ratio, Urine (82043-MALB) stable overall by hx and exam, ok to continue meds/tx as is   Problem # 3:  HYPERTENSION (ICD-401.9)  Her updated medication list for this problem includes:    Amlodipine Besylate 10 Mg Tabs (Amlodipine besylate) .Marland Kitchen... 1 by mouth once daily    Lasix 40 Mg Tabs (Furosemide) .Marland Kitchen... Take 1  tablet by mouth twice a day    Benazepril Hcl 40 Mg Tabs (Benazepril hcl) .Marland Kitchen... 1 by mouth once daily to re-start the amlodipine - she is not sure why not taking - some kind of oversight it seems;  Continue all other previous medications as before this visit   BP today: 142/84 Prior BP: 138/94 (03/18/2009)  Labs Reviewed: K+: 4.6 (03/18/2009) Creat: : 0.6 (03/18/2009)   Chol: 144 (03/18/2009)   HDL: 33.40 (03/18/2009)   LDL: 79 (03/18/2009)   TG: 157.0 (03/18/2009)  Complete Medication List: 1)  Actos 30 Mg Tabs (Pioglitazone hcl) .Marland Kitchen.. 1 by mouth once daily 2)  Amlodipine Besylate 10 Mg Tabs (Amlodipine besylate) .Marland Kitchen.. 1 by mouth once daily 3)  Levothyroxine Sodium 100 Mcg Tabs (Levothyroxine sodium) .Marland Kitchen.. 1 by mouth once daily 4)  Tramadol Hcl 50 Mg Tabs (Tramadol hcl) .Marland Kitchen.. 1 by mouth four times per day as needed for pain 5)  Lasix 40 Mg Tabs (Furosemide) .... Take 1 tablet by mouth twice a day 6)  Alprazolam 0.5 Mg Tabs (Alprazolam) .Marland Kitchen.. 1 by mouth once daily as needed 7)  Benazepril Hcl 40 Mg Tabs (Benazepril hcl) .Marland Kitchen.. 1 by mouth once daily 8)  Omeprazole 20 Mg Cpdr (Omeprazole) .Marland Kitchen.. 1 by mouth once daily 9)  Crestor 20 Mg Tabs (Rosuvastatin calcium) .Marland Kitchen.. 1 by mouth once daily 10)  Diclofenac Sodium 75 Mg Tbec (Diclofenac sodium) .Marland Kitchen.. 1 by mouth two times a day as needed 11)  Ecotrin Low Strength 81 Mg Tbec (Aspirin) .Marland Kitchen.. 1 by mouth once daily  Patient Instructions: 1)  please re-start the amlodipine 10 mg per day 2)  Continue all previous medications as before this visit  - you are given the refills today 3)  Please go to the Lab in the basement for your blood and/or urine tests today 4)  Please followup with the plastic surgeon for the right leg thigh tumor as planned 5)  Please schedule a follow-up appointment in 6 months with : 6)  BMP prior to visit, ICD-9: 250.02 7)  Lipid Panel prior to visit, ICD-9: 8)  HbgA1C prior to visit, ICD-9: Prescriptions: DICLOFENAC SODIUM 75 MG   TBEC (DICLOFENAC SODIUM) 1 by mouth two times a day as needed  #180 x 3   Entered and Authorized by:   Corwin Levins MD   Signed by:   Corwin Levins MD on 03/31/2010   Method used:   Print then Give to Patient   RxID:   [id number] CRESTOR 20 MG TABS (ROSUVASTATIN  CALCIUM) 1 by mouth once daily  #90 x 3   Entered and Authorized by:   Corwin Levins MD   Signed by:   Corwin Levins MD on 03/31/2010   Method used:   Print then Give to Patient   RxID:   [id number] OMEPRAZOLE 20 MG  CPDR (OMEPRAZOLE) 1 by mouth once daily  #90 x 3   Entered and Authorized by:   Corwin Levins MD   Signed by:   Corwin Levins MD on 03/31/2010   Method used:   Print then Give to Patient   RxID:   [id number] BENAZEPRIL HCL 40 MG  TABS (BENAZEPRIL HCL) 1 by mouth once daily  #90 x 3   Entered and Authorized by:   Corwin Levins MD   Signed by:   Corwin Levins MD on 03/31/2010   Method used:   Print then Give to Patient   RxID:   [id number] ALPRAZOLAM 0.5 MG  TABS (ALPRAZOLAM) 1 by mouth once daily as needed  #30 x 5   Entered and Authorized by:   Corwin Levins MD   Signed by:   Corwin Levins MD on 03/31/2010   Method used:   Print then Give to Patient   RxID:   [id number] TRAMADOL HCL 50 MG TABS (TRAMADOL HCL) 1 by mouth four times per day as needed for pain  #120 x 5   Entered and Authorized by:   Corwin Levins MD   Signed by:   Corwin Levins MD on 03/31/2010   Method used:   Print then Give to Patient   RxID:   [id number] LASIX 40 MG TABS (FUROSEMIDE) Take 1 tablet by mouth twice a day  #180 x 3   Entered and Authorized by:   Corwin Levins MD   Signed by:   Corwin Levins MD on 03/31/2010   Method used:   Print then Give to Patient   RxID:   [id number] LEVOTHYROXINE SODIUM 100 MCG TABS (LEVOTHYROXINE SODIUM) 1 by mouth once daily  #90 x 3   Entered and Authorized by:   Corwin Levins MD   Signed by:   Corwin Levins MD on 03/31/2010   Method used:   Print then Give to Patient   RxID:    [id number] AMLODIPINE BESYLATE 10 MG  TABS (AMLODIPINE BESYLATE) 1 by mouth once daily  #90 x 3   Entered and Authorized by:   Corwin Levins MD   Signed by:   Corwin Levins MD on 03/31/2010   Method used:   Print then Give to Patient   RxID:   [id number] ACTOS 30 MG TABS (PIOGLITAZONE HCL) 1 by mouth once daily  #90 x 3   Entered and Authorized by:   Corwin Levins MD   Signed by:   Corwin Levins MD on 03/31/2010   Method used:   Print then Give to Patient   RxID:   [id number]

## 2010-12-24 NOTE — Progress Notes (Signed)
Summary: Pharmacy?  Phone Note From Pharmacy   Caller: [facility] [phone] Summary of Call: Pharmacy called stating that a more specific quantity of mouthwash is needed, please advise of ml or oz. Initial call taken by: Margaret Pyle, CMA,  November 04, 2010 4:49 PM  Follow-up for Phone Call        240 cc Follow-up by: Corwin Levins MD,  November 04, 2010 6:57 PM  Additional Follow-up for Phone Call Additional follow up Details #1::        Tresa Endo at pharmacy advised Additional Follow-up by: Margaret Pyle, CMA,  November 05, 2010 9:12 AM

## 2010-12-25 NOTE — Letter (Signed)
Summary: [facility]   Imported By: Lester Minerva 10/22/2010 07:40:16  _____________________________________________________________________  External Attachment:    Type:   Image     Comment:   External Document

## 2010-12-25 NOTE — Letter (Signed)
Summary: [facility]   Imported By: Lester Morrow 10/16/2010 10:12:34  _____________________________________________________________________  External Attachment:    Type:   Image     Comment:   External Document

## 2010-12-25 NOTE — Letter (Signed)
Summary: Patient Instructions/[facility]   Imported By: Lester Orleans 09/29/2010 08:06:36  _____________________________________________________________________  External Attachment:    Type:   Image     Comment:   External Document

## 2010-12-30 NOTE — Letter (Signed)
Summary: Discharge Medications/[facility]  Discharge Medications/[facility]   Imported By: Sherian Rein 12/25/2010 11:33:14  _____________________________________________________________________  External Attachment:    Type:   Image     Comment:   External Document

## 2010-12-30 NOTE — Progress Notes (Signed)
Summary: RX refill req  Phone Note Refill Request Message from:  Patient on December 23, 2010 11:19 AM  Refills Requested: Medication #1:  BENAZEPRIL HCL 40 MG  TABS 1 by mouth once daily   Dosage confirmed as above?Dosage Confirmed   Supply Requested: 3 months  Method Requested: Electronic Initial call taken by: Margaret Pyle, CMA,  December 23, 2010 11:19 AM    Prescriptions: BENAZEPRIL HCL 40 MG  TABS (BENAZEPRIL HCL) 1 by mouth once daily  #90 x 1   Entered by:   Margaret Pyle, CMA   Authorized by:   Corwin Levins MD   Signed by:   Margaret Pyle, CMA on 12/23/2010   Method used:   Electronically to        [facility] [id number] (retail)       [address] [place], [place]  [address]       Ph: [phone]       Fax: [phone]   RxID:   [id number]

## 2011-01-07 NOTE — Miscellaneous (Signed)
Summary: Care Plans/[facility]  Care Plans/[facility]   Imported By: Sherian Rein 12/28/2010 09:06:39  _____________________________________________________________________  External Attachment:    Type:   Image     Comment:   External Document

## 2011-01-12 ENCOUNTER — Encounter: Payer: Self-pay | Admitting: Internal Medicine

## 2011-01-13 NOTE — Letter (Signed)
Summary: Wynonia Hazard MD/[facility]  Wynonia Hazard MD/[facility]   Imported By: Lester [place] 01/07/2011 09:23:57  _____________________________________________________________________  External Attachment:    Type:   Image     Comment:   External Document

## 2011-01-19 NOTE — Miscellaneous (Signed)
Summary: [facility]   Imported By: Sherian Rein 01/13/2011 14:53:22  _____________________________________________________________________  External Attachment:    Type:   Image     Comment:   External Document

## 2011-01-19 NOTE — Miscellaneous (Signed)
Summary: [facility]   Imported By: Sherian Rein 01/13/2011 14:54:25  _____________________________________________________________________  External Attachment:    Type:   Image     Comment:   External Document

## 2011-01-19 NOTE — Letter (Signed)
Summary: Bronson Ing MD/[facility]  Bronson Ing MD/[facility]   Imported By: Lester [place] 01/15/2011 09:38:12  _____________________________________________________________________  External Attachment:    Type:   Image     Comment:   External Document

## 2011-01-19 NOTE — Miscellaneous (Signed)
Summary: Face to Face encounter/[facility]  Face to Face encounter/[facility]   Imported By: Sherian Rein 01/14/2011 14:44:27  _____________________________________________________________________  External Attachment:    Type:   Image     Comment:   External Document

## 2011-02-02 ENCOUNTER — Encounter: Payer: Self-pay | Admitting: Internal Medicine

## 2011-02-02 ENCOUNTER — Ambulatory Visit (INDEPENDENT_AMBULATORY_CARE_PROVIDER_SITE_OTHER): Payer: 59 | Admitting: Internal Medicine

## 2011-02-02 DIAGNOSIS — E119 Type 2 diabetes mellitus without complications: Secondary | ICD-10-CM

## 2011-02-02 DIAGNOSIS — F411 Generalized anxiety disorder: Secondary | ICD-10-CM

## 2011-02-02 DIAGNOSIS — E785 Hyperlipidemia, unspecified: Secondary | ICD-10-CM

## 2011-02-02 DIAGNOSIS — I10 Essential (primary) hypertension: Secondary | ICD-10-CM

## 2011-02-09 NOTE — Assessment & Plan Note (Signed)
Summary: 3 MOS FU/ CD #   Vital Signs:  Patient profile:   65 year old female Height:      63 inches Weight:      280 pounds BMI:     49.78 O2 Sat:      95 % on Room air Temp:     98.5 degrees F oral Pulse rate:   78 / minute BP sitting:   130 / 70  (left arm) Cuff size:   large  Vitals Entered By: Zella Ball Ewing CMA Duncan Dull) (February 02, 2011 3:27 PM)  O2 Flow:  Room air  CC: 3 month ROV/RE   CC:  3 month ROV/RE.  History of Present Illness: here to f/u;  overall doing ok;  Pt denies CP, worsening sob, doe, wheezing, orthopnea, pnd, worsening LE edema, palps, dizziness or syncope  Pt denies new neuro symptoms such as headache, facial or extremity weakness  Pt denies polydipsia, polyuria, or low sugar symptoms such as shakiness improved with eating.  Overall good compliance with meds, trying to follow low chol, DM diet, wt stable, little excercise however  CBG's in the lower 100's;  No fever, wt loss, night sweats, loss of appetite or other constitutional symptoms  Denies worsening depressive symptoms, suicidal ideation, or panic.,though has ongoing anxiety, not worse recently.    Problems Prior to Update: 1)  Vitamin D Deficiency  (ICD-268.9) 2)  Preventive Health Care  (ICD-V70.0) 3)  Knee Pain, Right, Chronic  (ICD-719.46) 4)  Preventive Health Care  (ICD-V70.0) 5)  Depression  (ICD-311) 6)  Peripheral Edema  (ICD-782.3) 7)  Insomnia  (ICD-780.52) 8)  Low Back Pain  (ICD-724.2) 9)  Diabetes Mellitus, Type II  (ICD-250.00) 10)  Anxiety  (ICD-300.00) 11)  Aortic Insufficiency  (ICD-424.1) 12)  Osteoarthritis  (ICD-715.90) 13)  Morbid Obesity  (ICD-278.01) 14)  Hypertension  (ICD-401.9) 15)  Hyperthyroidism  (ICD-242.90) 16)  Hyperlipidemia  (ICD-272.4) 17)  Gerd  (ICD-530.81)  Medications Prior to Update: 1)  Amlodipine Besylate 10 Mg  Tabs (Amlodipine Besylate) .Marland Kitchen.. 1 By Mouth Once Daily 2)  Levothyroxine Sodium 100 Mcg Tabs (Levothyroxine Sodium) .Marland Kitchen.. 1 By Mouth Once  Daily 3)  Tramadol Hcl 50 Mg Tabs (Tramadol Hcl) .Marland Kitchen.. 1 By Mouth Four Times Per Day As Needed For Pain 4)  Alprazolam 0.5 Mg  Tabs (Alprazolam) .Marland Kitchen.. 1 By Mouth Once Daily As Needed 5)  Benazepril Hcl 40 Mg  Tabs (Benazepril Hcl) .Marland Kitchen.. 1 By Mouth Once Daily 6)  Omeprazole 20 Mg  Cpdr (Omeprazole) .Marland Kitchen.. 1 By Mouth Once Daily 7)  Crestor 20 Mg Tabs (Rosuvastatin Calcium) .Marland Kitchen.. 1 By Mouth Once Daily 8)  Ecotrin Low Strength 81 Mg  Tbec (Aspirin) .Marland Kitchen.. 1 By Mouth Once Daily 9)  Hydrochlorothiazide 25 Mg Tabs (Hydrochlorothiazide) .Marland Kitchen.. 1 By Mouth Once Daily 10)  Oxycodone Hcl 5 Mg Tabs (Oxycodone Hcl) .Marland Kitchen.. 1 By Mouth Every 3 Hours As Needed 11)  Lidoderm 5 % Ptch (Lidocaine) .Marland Kitchen.. 1 Patch Each Knee Once Daily 12)  Colace 100 Mg Caps (Docusate Sodium) .Marland Kitchen.. 1 By Mouth Two Times A Day 13)  Zolpidem Tartrate 5 Mg Tabs (Zolpidem Tartrate) .Marland Kitchen.. 1 By Mouth At Bedtime 14)  Lexapro 10 Mg Tabs (Escitalopram Oxalate) .Marland Kitchen.. 1 By Mouth Once Daily 15)  Magic Mouthwash .... 5 Cc By Mouth Q 6 Hrs As Needed 16)  Onetouch Test  Strp (Glucose Blood) .... Use Asd 1 Once Daily 17)  Lancets  Misc (Lancets) .... Use Asd 1 Once Daily 18)  Actos 15 Mg Tabs (Pioglitazone Hcl) .Marland Kitchen.. 1po Once Daily 19)  Zantac 150 Mg Tabs (Ranitidine Hcl) .Marland Kitchen.. 1po Once Daily As Needed  Current Medications (verified): 1)  Amlodipine Besylate 10 Mg  Tabs (Amlodipine Besylate) .Marland Kitchen.. 1 By Mouth Once Daily 2)  Levothyroxine Sodium 100 Mcg Tabs (Levothyroxine Sodium) .Marland Kitchen.. 1 By Mouth Once Daily 3)  Tramadol Hcl 50 Mg Tabs (Tramadol Hcl) .Marland Kitchen.. 1 By Mouth Four Times Per Day As Needed For Pain 4)  Alprazolam 0.5 Mg  Tabs (Alprazolam) .Marland Kitchen.. 1 By Mouth Once Daily As Needed 5)  Benazepril Hcl 40 Mg  Tabs (Benazepril Hcl) .Marland Kitchen.. 1 By Mouth Once Daily 6)  Omeprazole 20 Mg  Cpdr (Omeprazole) .Marland Kitchen.. 1 By Mouth Once Daily 7)  Crestor 20 Mg Tabs (Rosuvastatin Calcium) .Marland Kitchen.. 1 By Mouth Once Daily 8)  Ecotrin Low Strength 81 Mg  Tbec (Aspirin) .Marland Kitchen.. 1 By Mouth Once Daily 9)   Hydrochlorothiazide 25 Mg Tabs (Hydrochlorothiazide) .Marland Kitchen.. 1 By Mouth Once Daily 10)  Oxycodone Hcl 5 Mg Tabs (Oxycodone Hcl) .Marland Kitchen.. 1 By Mouth Every 3 Hours As Needed 11)  Lidoderm 5 % Ptch (Lidocaine) .Marland Kitchen.. 1 Patch Each Knee Once Daily 12)  Lexapro 10 Mg Tabs (Escitalopram Oxalate) .Marland Kitchen.. 1 By Mouth Once Daily 13)  Onetouch Test  Strp (Glucose Blood) .... Use Asd 1 Once Daily 14)  Lancets  Misc (Lancets) .... Use Asd 1 Once Daily 15)  Actos 15 Mg Tabs (Pioglitazone Hcl) .Marland Kitchen.. 1po Once Daily 16)  Zantac 150 Mg Tabs (Ranitidine Hcl) .Marland Kitchen.. 1po Once Daily As Needed 17)  Diclofenac Sodium 75 Mg Tbec (Diclofenac Sodium) .Marland Kitchen.. 1po Two Times A Day As Needed  Allergies (verified): No Known Drug Allergies  Past History:  Past Medical History: Last updated: 11/04/2010 GERD Hyperlipidemia Hyperthyroidism Hypertension Morbid Obesity Osteoarthritis- Knee Aortic Insufficiency Anxiety Diabetes mellitus, type II Low back pain, recurrent Insomnia Peripheral Edema Depression vit d deficiency  Past Surgical History: Last updated: 09/23/2007 Hysterectomy Oophorectomy Cholecystectomy  Social History: Last updated: 03/31/2010 Never Smoked Alcohol use-yes Drug use-no  Risk Factors: Smoking Status: never (09/27/2007)  Review of Systems       all otherwise negative per pt -    Physical Exam  General:  alert and overweight-appearing.   Head:  normocephalic and atraumatic.   Eyes:  vision grossly intact, pupils equal, and pupils round.   Ears:  R ear normal and L ear normal.   Nose:  no external deformity and no nasal discharge.   Mouth:  no gingival abnormalities and pharynx pink and moist.   Neck:  supple and no masses.   Lungs:  normal respiratory effort and normal breath sounds.   Heart:  normal rate and regular rhythm.   Extremities:  chronic 1+ bilat edema, right more than left, no ulcers or erythema Psych:  not depressed appearing and slightly anxious.     Impression &  Recommendations:  Problem # 1:  DIABETES MELLITUS, TYPE II (ICD-250.00)  Her updated medication list for this problem includes:    Benazepril Hcl 40 Mg Tabs (Benazepril hcl) .Marland Kitchen... 1 by mouth once daily    Ecotrin Low Strength 81 Mg Tbec (Aspirin) .Marland Kitchen... 1 by mouth once daily    Actos 15 Mg Tabs (Pioglitazone hcl) .Marland Kitchen... 1po once daily  Labs Reviewed: Creat: 1.3 (11/04/2010)    Reviewed HgBA1c results: 7.5 (11/04/2010)  7.1 (03/31/2010) stable overall by hx and exam, ok to continue meds/tx as is   Problem # 2:  HYPERTENSION (ICD-401.9)  Her  updated medication list for this problem includes:    Amlodipine Besylate 10 Mg Tabs (Amlodipine besylate) .Marland Kitchen... 1 by mouth once daily    Benazepril Hcl 40 Mg Tabs (Benazepril hcl) .Marland Kitchen... 1 by mouth once daily    Hydrochlorothiazide 25 Mg Tabs (Hydrochlorothiazide) .Marland Kitchen... 1 by mouth once daily  BP today: 130/70 Prior BP: 108/62 (11/04/2010)  Labs Reviewed: K+: 3.8 (11/04/2010) Creat: : 1.3 (11/04/2010)   Chol: 134 (11/04/2010)   HDL: 28.90 (11/04/2010)   LDL: 80 (11/04/2010)   TG: 126.0 (11/04/2010) stable overall by hx and exam, ok to continue meds/tx as is   Problem # 3:  HYPERLIPIDEMIA (ICD-272.4)  Her updated medication list for this problem includes:    Crestor 20 Mg Tabs (Rosuvastatin calcium) .Marland Kitchen... 1 by mouth once daily  Labs Reviewed: SGOT: 20 (03/31/2010)   SGPT: 14 (03/31/2010)   HDL:28.90 (11/04/2010), 37.70 (03/31/2010)  LDL:80 (11/04/2010), 56 (03/31/2010)  Chol:134 (11/04/2010), 109 (03/31/2010)  Trig:126.0 (11/04/2010), 79.0 (03/31/2010) stable overall by hx and exam, ok to continue meds/tx as is   Problem # 4:  ANXIETY (ICD-300.00)  Her updated medication list for this problem includes:    Alprazolam 0.5 Mg Tabs (Alprazolam) .Marland Kitchen... 1 by mouth once daily as needed    Lexapro 10 Mg Tabs (Escitalopram oxalate) .Marland Kitchen... 1 by mouth once daily  Discussed medication use and relaxation techniques.  stable overall by hx and exam, ok to  continue meds/tx as is   Complete Medication List: 1)  Amlodipine Besylate 10 Mg Tabs (Amlodipine besylate) .Marland Kitchen.. 1 by mouth once daily 2)  Levothyroxine Sodium 100 Mcg Tabs (Levothyroxine sodium) .Marland Kitchen.. 1 by mouth once daily 3)  Tramadol Hcl 50 Mg Tabs (Tramadol hcl) .Marland Kitchen.. 1 by mouth four times per day as needed for pain 4)  Alprazolam 0.5 Mg Tabs (Alprazolam) .Marland Kitchen.. 1 by mouth once daily as needed 5)  Benazepril Hcl 40 Mg Tabs (Benazepril hcl) .Marland Kitchen.. 1 by mouth once daily 6)  Omeprazole 20 Mg Cpdr (Omeprazole) .Marland Kitchen.. 1 by mouth once daily 7)  Crestor 20 Mg Tabs (Rosuvastatin calcium) .Marland Kitchen.. 1 by mouth once daily 8)  Ecotrin Low Strength 81 Mg Tbec (Aspirin) .Marland Kitchen.. 1 by mouth once daily 9)  Hydrochlorothiazide 25 Mg Tabs (Hydrochlorothiazide) .Marland Kitchen.. 1 by mouth once daily 10)  Oxycodone Hcl 5 Mg Tabs (Oxycodone hcl) .Marland Kitchen.. 1 by mouth every 3 hours as needed 11)  Lidoderm 5 % Ptch (Lidocaine) .Marland Kitchen.. 1 patch each knee once daily 12)  Lexapro 10 Mg Tabs (Escitalopram oxalate) .Marland Kitchen.. 1 by mouth once daily 13)  Onetouch Test Strp (Glucose blood) .... Use asd 1 once daily 14)  Lancets Misc (Lancets) .... Use asd 1 once daily 15)  Actos 15 Mg Tabs (Pioglitazone hcl) .Marland Kitchen.. 1po once daily 16)  Zantac 150 Mg Tabs (Ranitidine hcl) .Marland Kitchen.. 1po once daily as needed 17)  Diclofenac Sodium 75 Mg Tbec (Diclofenac sodium) .Marland Kitchen.. 1po two times a day as needed  Patient Instructions: 1)  Continue all previous medications as before this visit  2)  Please schedule a follow-up appointment in 6 months. Prescriptions: DICLOFENAC SODIUM 75 MG TBEC (DICLOFENAC SODIUM) 1po two times a day as needed  #60 x 2   Entered and Authorized by:   Corwin Levins MD   Signed by:   Corwin Levins MD on 02/02/2011   Method used:   Print then Give to Patient   RxID:   [id number] ZANTAC 150 MG TABS (RANITIDINE HCL) 1po once daily as needed  #30 x  11   Entered and Authorized by:   Corwin Levins MD   Signed by:   Corwin Levins MD on 02/02/2011   Method  used:   Print then Give to Patient   RxID:   [id number] ACTOS 15 MG TABS (PIOGLITAZONE HCL) 1po once daily  #30 x 11   Entered and Authorized by:   Corwin Levins MD   Signed by:   Corwin Levins MD on 02/02/2011   Method used:   Print then Give to Patient   RxID:   [id number] LEXAPRO 10 MG TABS (ESCITALOPRAM OXALATE) 1 by mouth once daily  #30 x 11   Entered and Authorized by:   Corwin Levins MD   Signed by:   Corwin Levins MD on 02/02/2011   Method used:   Print then Give to Patient   RxID:   [id number] HYDROCHLOROTHIAZIDE 25 MG TABS (HYDROCHLOROTHIAZIDE) 1 by mouth once daily  #30 x 11   Entered and Authorized by:   Corwin Levins MD   Signed by:   Corwin Levins MD on 02/02/2011   Method used:   Print then Give to Patient   RxID:   [id number] CRESTOR 20 MG TABS (ROSUVASTATIN CALCIUM) 1 by mouth once daily  #30 x 11   Entered and Authorized by:   Corwin Levins MD   Signed by:   Corwin Levins MD on 02/02/2011   Method used:   Print then Give to Patient   RxID:   [id number] OMEPRAZOLE 20 MG  CPDR (OMEPRAZOLE) 1 by mouth once daily  #30 x 11   Entered and Authorized by:   Corwin Levins MD   Signed by:   Corwin Levins MD on 02/02/2011   Method used:   Print then Give to Patient   RxID:   [id number] BENAZEPRIL HCL 40 MG  TABS (BENAZEPRIL HCL) 1 by mouth once daily  #30 x 11   Entered and Authorized by:   Corwin Levins MD   Signed by:   Corwin Levins MD on 02/02/2011   Method used:   Print then Give to Patient   RxID:   [id number] ALPRAZOLAM 0.5 MG  TABS (ALPRAZOLAM) 1 by mouth once daily as needed  #30 x 5   Entered and Authorized by:   Corwin Levins MD   Signed by:   Corwin Levins MD on 02/02/2011   Method used:   Print then Give to Patient   RxID:   [id number] TRAMADOL HCL 50 MG TABS (TRAMADOL HCL) 1 by mouth four times per day as needed for pain  #240 x 3   Entered and Authorized by:   Corwin Levins MD   Signed by:   Corwin Levins MD on 02/02/2011    Method used:   Print then Give to Patient   RxID:   [id number] LEVOTHYROXINE SODIUM 100 MCG TABS (LEVOTHYROXINE SODIUM) 1 by mouth once daily  #30 x 11   Entered and Authorized by:   Corwin Levins MD   Signed by:   Corwin Levins MD on 02/02/2011   Method used:   Print then Give to Patient   RxID:   [id number] AMLODIPINE BESYLATE 10 MG  TABS (AMLODIPINE BESYLATE) 1 by mouth once daily  #30 x 11   Entered and Authorized by:   Corwin Levins MD   Signed by:   Corwin Levins MD on 02/02/2011  Method used:   Print then Give to Patient   RxID:   [id number]    Orders Added: 1)  Est. Patient Level IV [56387]

## 2011-07-30 ENCOUNTER — Encounter: Payer: Self-pay | Admitting: Internal Medicine

## 2011-07-30 DIAGNOSIS — Z0000 Encounter for general adult medical examination without abnormal findings: Secondary | ICD-10-CM | POA: Insufficient documentation

## 2011-08-04 ENCOUNTER — Ambulatory Visit: Payer: Medicare Other | Admitting: Internal Medicine

## 2011-09-03 ENCOUNTER — Other Ambulatory Visit: Payer: Self-pay | Admitting: Internal Medicine

## 2011-09-03 NOTE — Telephone Encounter (Signed)
Faxed hardcopy to [address] [place] [phone]

## 2011-09-03 NOTE — Telephone Encounter (Signed)
Done hardcoyp to robin

## 2011-09-24 ENCOUNTER — Other Ambulatory Visit: Payer: Self-pay | Admitting: Internal Medicine

## 2011-10-25 ENCOUNTER — Other Ambulatory Visit: Payer: Self-pay | Admitting: Internal Medicine

## 2012-01-27 ENCOUNTER — Telehealth: Payer: Self-pay

## 2012-01-27 NOTE — Telephone Encounter (Signed)
For this , would have to insist on OV  Pt should consider going to ER even by ambulance if increasing pain, fever, sob, wheezing or any blood, worsening abd pain

## 2012-01-27 NOTE — Telephone Encounter (Signed)
The patient called informing she is having cough, congestion and urinating more frequently and would like something called in. I informed the patient that she would need an OV as has not been seen by [facility] since 11/04/10.  She informed she has difficulty coming in as is in a wheel chair and needs transportation. Informed once again to schedule OV, please advise

## 2012-01-28 ENCOUNTER — Encounter: Payer: Self-pay | Admitting: Internal Medicine

## 2012-01-28 ENCOUNTER — Ambulatory Visit (INDEPENDENT_AMBULATORY_CARE_PROVIDER_SITE_OTHER): Payer: Medicare Other | Admitting: Internal Medicine

## 2012-01-28 ENCOUNTER — Ambulatory Visit (INDEPENDENT_AMBULATORY_CARE_PROVIDER_SITE_OTHER)
Admission: RE | Admit: 2012-01-28 | Discharge: 2012-01-28 | Disposition: A | Discharge status: Home / Self Care | Payer: Medicare Other | Source: Ambulatory Visit | Attending: Internal Medicine | Admitting: Internal Medicine

## 2012-01-28 ENCOUNTER — Other Ambulatory Visit (INDEPENDENT_AMBULATORY_CARE_PROVIDER_SITE_OTHER): Payer: Medicare Other

## 2012-01-28 VITALS — BP 128/62 | HR 85 | Temp 98.3°F | Ht 62.0 in | Wt 310.0 lb

## 2012-01-28 DIAGNOSIS — J209 Acute bronchitis, unspecified: Secondary | ICD-10-CM

## 2012-01-28 DIAGNOSIS — R350 Frequency of micturition: Secondary | ICD-10-CM

## 2012-01-28 DIAGNOSIS — R5383 Other fatigue: Secondary | ICD-10-CM

## 2012-01-28 DIAGNOSIS — R10819 Abdominal tenderness, unspecified site: Secondary | ICD-10-CM

## 2012-01-28 DIAGNOSIS — R062 Wheezing: Secondary | ICD-10-CM

## 2012-01-28 DIAGNOSIS — E119 Type 2 diabetes mellitus without complications: Secondary | ICD-10-CM

## 2012-01-28 DIAGNOSIS — R5381 Other malaise: Secondary | ICD-10-CM

## 2012-01-28 DIAGNOSIS — M1711 Unilateral primary osteoarthritis, right knee: Secondary | ICD-10-CM

## 2012-01-28 DIAGNOSIS — R108A2 Left flank tenderness: Secondary | ICD-10-CM

## 2012-01-28 HISTORY — DX: Unilateral primary osteoarthritis, right knee: M17.11

## 2012-01-28 LAB — CBC WITH DIFFERENTIAL/PLATELET
Basophils Absolute: 0 10*3/uL (ref 0.0–0.1)
Eosinophils Absolute: 0 10*3/uL (ref 0.0–0.7)
HCT: 38.1 % (ref 36.0–46.0)
Hemoglobin: 12.7 g/dL (ref 12.0–15.0)
Lymphs Abs: 0.8 10*3/uL (ref 0.7–4.0)
MCHC: 33.3 g/dL (ref 30.0–36.0)
MCV: 86 fl (ref 78.0–100.0)
Monocytes Absolute: 0.5 10*3/uL (ref 0.1–1.0)
Monocytes Relative: 15.2 % — ABNORMAL HIGH (ref 3.0–12.0)
Neutro Abs: 2 10*3/uL (ref 1.4–7.7)
Platelets: 142 10*3/uL — ABNORMAL LOW (ref 150.0–400.0)
RDW: 15.3 % — ABNORMAL HIGH (ref 11.5–14.6)

## 2012-01-28 LAB — HEMOGLOBIN A1C: Hgb A1c MFr Bld: 9.5 % — ABNORMAL HIGH (ref 4.6–6.5)

## 2012-01-28 LAB — MICROALBUMIN / CREATININE URINE RATIO: Creatinine,U: 206.8 mg/dL

## 2012-01-28 MED ORDER — METHYLPREDNISOLONE ACETATE PF 80 MG/ML IJ SUSP
120.0000 mg | Freq: Once | INTRAMUSCULAR | Status: AC
  Administered 2012-01-28: 120 mg via INTRAMUSCULAR

## 2012-01-28 MED ORDER — ROSUVASTATIN CALCIUM 20 MG PO TABS: 20.0000 mg | ORAL_TABLET | Freq: Every day | ORAL | Status: DC

## 2012-01-28 MED ORDER — TRAMADOL HCL 50 MG PO TABS: 50.0000 mg | ORAL_TABLET | Freq: Four times a day (QID) | ORAL | Status: DC | PRN

## 2012-01-28 MED ORDER — HYDROCODONE-HOMATROPINE 5-1.5 MG/5ML PO SYRP: 5.0000 mL | ORAL_SOLUTION | Freq: Four times a day (QID) | ORAL | Status: AC | PRN

## 2012-01-28 MED ORDER — OMEPRAZOLE 20 MG PO CPDR: 20.0000 mg | DELAYED_RELEASE_CAPSULE | Freq: Every day | ORAL | Status: DC

## 2012-01-28 MED ORDER — BENAZEPRIL HCL 40 MG PO TABS: 40.0000 mg | ORAL_TABLET | Freq: Every day | ORAL | Status: DC

## 2012-01-28 MED ORDER — PIOGLITAZONE HCL 15 MG PO TABS: 15.0000 mg | ORAL_TABLET | Freq: Every day | ORAL | Status: DC

## 2012-01-28 MED ORDER — CEFTRIAXONE SODIUM 1 G IJ SOLR
1.0000 g | Freq: Once | INTRAMUSCULAR | Status: AC
  Administered 2012-01-28: 1 g via INTRAMUSCULAR

## 2012-01-28 MED ORDER — CEPHALEXIN 500 MG PO CAPS: 500.0000 mg | ORAL_CAPSULE | Freq: Four times a day (QID) | ORAL | Status: AC

## 2012-01-28 MED ORDER — PREDNISONE 10 MG PO TABS: ORAL_TABLET | ORAL | Status: DC

## 2012-01-28 MED ORDER — LEVOTHYROXINE SODIUM 100 MCG PO TABS: 100.0000 ug | ORAL_TABLET | Freq: Every day | ORAL | Status: DC

## 2012-01-28 MED ORDER — AMLODIPINE BESYLATE 10 MG PO TABS: 10.0000 mg | ORAL_TABLET | Freq: Every day | ORAL | Status: DC

## 2012-01-28 MED ORDER — ALPRAZOLAM 0.5 MG PO TABS: 0.5000 mg | ORAL_TABLET | Freq: Every day | ORAL | Status: DC | PRN

## 2012-01-28 NOTE — Telephone Encounter (Signed)
Called the patient informed of MD's instructions and she agreed to schedule appointment today 01/28/12.

## 2012-01-28 NOTE — Patient Instructions (Addendum)
You had the antibiotic and steroid shots today (rocephin and depomedrol) Your blood sugar in the office today was: 216 Take all new medications as prescribed - the antibiotic, cough medicine, and prednisone Continue all other medications as before All of your refills were done today as requested Please go to XRAY in the Basement for the x-ray test Please go to LAB in the Basement for the blood and/or urine tests to be done today Please call the phone number [phone] (the [employer] System) for results of testing in 2-3 days;  When calling, simply dial the number, and when prompted enter the MRN number above (the Medical Record Number) and the # key, then the message should start. Please return in 6 months, or sooner if needed

## 2012-01-29 ENCOUNTER — Encounter: Payer: Self-pay | Admitting: Internal Medicine

## 2012-01-29 NOTE — Assessment & Plan Note (Signed)
Mild to mod, for antibx course,  to f/u any worsening symptoms or concerns 

## 2012-01-29 NOTE — Assessment & Plan Note (Signed)
Mild to mod, for depomedrol IM and predpack asd  to f/u any worsening symptoms or concerns

## 2012-01-29 NOTE — Assessment & Plan Note (Addendum)
Etiology unclear, Exam otherwise benign, to check labs as documented, follow with expectant management  Note;  Total time for pt hx, exam, review of record with pt in room, determination of diagnosis, and plan for further eval and tx is > 40 min

## 2012-01-29 NOTE — Assessment & Plan Note (Signed)
Mild to mod, for tylenol prn - ? MSK, but for urine studies as above,  to f/u any worsening symptoms or concerns

## 2012-01-29 NOTE — Assessment & Plan Note (Signed)
stable overall by hx and exam, most recent data reviewed with pt, and pt to continue medical treatment as before  Lab Results  Component Value Date   HGBA1C 7.5* 11/04/2010

## 2012-01-29 NOTE — Assessment & Plan Note (Signed)
Unclear etiology, but will need to r/o UTI - for urine studies,  to f/u any worsening symptoms or concerns

## 2012-01-29 NOTE — Progress Notes (Signed)
Subjective:    Patient ID: Clint Bolder, female    DOB: 06-21-1946, 66 y.o.   MRN: [id number]  HPI  Here to f/u with acute, not seen recently due to severe mobility issue related to endstage but nonsurgical right knee pain/deformity, walks with walker only, unable to step up to exam table today - examined seated in chair.  Here with acute onset mild to mod 2-3 days ST, HA, general weakness and malaise, with prod cough greenish sputum, but Pt denies chest pain, increased sob or doe, wheezing, orthopnea, PND, increased LE swelling, palpitations, dizziness or syncope except for onset midl wheezing yesterday. Pt denies new neurological symptoms such as new headache, or facial or extremity weakness or numbness   Pt denies polydipsia, polyuria, or low sugar symptoms such as weakness or confusion improved with po intake.  Pt states overall good compliance with meds, trying to follow lower cholesterol, diabetic diet, wt overall stable but little exercise however.   Does also incidnetly have urinary freq for several days, left flank/LBP nonradicular, no rash, ? UTI. Does have sense of ongoing fatigue, but denies signficant hypersomnolence. Past Medical History  Diagnosis Date  . ANXIETY 09/23/2007  . AORTIC INSUFFICIENCY 09/23/2007  . DEPRESSION 09/23/2007  . DIABETES MELLITUS, TYPE II 09/23/2007  . GERD 09/23/2007  . HYPERLIPIDEMIA 09/23/2007  . HYPERTENSION 09/23/2007  . HYPERTHYROIDISM 09/23/2007  . INSOMNIA 09/23/2007  . KNEE PAIN, RIGHT, CHRONIC 03/14/2008  . LOW BACK PAIN 09/23/2007  . Morbid obesity 09/23/2007  . OSTEOARTHRITIS 09/23/2007  . PERIPHERAL EDEMA 09/23/2007  . VITAMIN D DEFICIENCY 03/31/2010  . Degenerative arthritis of right knee 01/28/2012   Past Surgical History  Procedure Date  . Abdominal hysterectomy   . Cholecystectomy   . Oophorectomy     reports that she has never smoked. She does not have any smokeless tobacco history on file. She reports that she drinks alcohol. She reports that  she does not use illicit drugs. family history includes Hypertension in her other. No Known Allergies Current Outpatient Prescriptions on File Prior to Visit  Medication Sig Dispense Refill  . aspirin 81 MG tablet Take 81 mg by mouth daily.        . diclofenac (VOLTAREN) 75 MG EC tablet take 1 tablet by mouth twice a day if needed  60 tablet  2  . glucose blood (ONE TOUCH TEST STRIPS) test strip Use as directed once daily       . hydrochlorothiazide 25 MG tablet Take 25 mg by mouth daily.        . Lancets MISC Use as directed once daily       . ranitidine (ZANTAC) 150 MG tablet Take 150 mg by mouth daily as needed.         Review of Systems Review of Systems  Constitutional: Negative for diaphoresis and unexpected weight change.  HENT: Negative for drooling and tinnitus.   Eyes: Negative for photophobia and visual disturbance.  Respiratory: Negative for choking and stridor.   Gastrointestinal: Negative for vomiting and blood in stool.  Genitourinary: Negative for hematuria and decreased urine volume.    Objective:   Physical Exam BP 128/62  Pulse 85  Temp(Src) 98.3 F (36.8 C) (Oral)  Ht 5\' 2"  (1.575 m)  Wt 310 lb (140.615 kg)  BMI 56.70 kg/m2  SpO2 93% Physical Exam  VS noted, mild ill, morbid obese.  HENT: Head: Normocephalic.  Right Ear: External ear normal.  Left Ear: External ear normal.  Bilat tm's mild  erythema.  Sinus nontender.  Pharynx mild erythema Eyes: Conjunctivae and EOM are normal. Pupils are equal, round, and reactive to light.  Neck: Normal range of motion. Neck supple.  Cardiovascular: Normal rate and regular rhythm.   Pulmonary/Chest: Effort normal and breath sounds with midl decr BS and wheezes bilat.  Abd:  Soft, NT, non-distended, + BS + left flank tender Neurological: Pt is alert. No cranial nerve deficit.  Skin: Skin is warm. No erythema.  Psychiatric: Pt behavior is normal. Thought content normal. 1+ nervous     Assessment & Plan:

## 2012-01-30 LAB — URINE CULTURE: Organism ID, Bacteria: NO GROWTH

## 2012-01-31 LAB — BASIC METABOLIC PANEL
Calcium: 8.9 mg/dL (ref 8.4–10.5)
GFR: 76.37 mL/min (ref >60.00)
Glucose, Bld: 190 mg/dL — ABNORMAL HIGH (ref 70–99)
Potassium: 3.9 mEq/L (ref 3.5–5.1)
Sodium: 136 mEq/L (ref 135–145)

## 2012-01-31 LAB — HEPATIC FUNCTION PANEL
AST: 46 U/L — ABNORMAL HIGH (ref 0–37)
Albumin: 3.6 g/dL (ref 3.5–5.2)
Alkaline Phosphatase: 81 U/L (ref 39–117)
Bilirubin, Direct: 0.1 mg/dL (ref 0.0–0.3)
Total Bilirubin: 0.3 mg/dL (ref 0.3–1.2)

## 2012-01-31 LAB — LIPID PANEL
Cholesterol: 128 mg/dL (ref 0–200)
LDL Cholesterol: 73 mg/dL (ref 0–99)
Triglycerides: 100 mg/dL (ref 0.0–149.0)

## 2012-02-07 ENCOUNTER — Telehealth: Payer: Self-pay | Admitting: Internal Medicine

## 2012-02-07 ENCOUNTER — Encounter: Payer: Self-pay | Admitting: Internal Medicine

## 2012-02-07 MED ORDER — PIOGLITAZONE HCL 45 MG PO TABS: 45.0000 mg | ORAL_TABLET | Freq: Every day | ORAL | Status: DC

## 2012-02-07 NOTE — Telephone Encounter (Signed)
Recent a1c 9.5, goal < 7  To increase the actos to 45 mg - done escript  Robin to notify pt, letter sent

## 2012-02-08 NOTE — Telephone Encounter (Signed)
Called informed the patient of results and medication.  Also informed the patient she would receive letter with her lab results.

## 2012-05-14 ENCOUNTER — Other Ambulatory Visit: Payer: Self-pay | Admitting: Internal Medicine

## 2012-05-15 NOTE — Telephone Encounter (Signed)
Done erx 

## 2012-07-16 ENCOUNTER — Other Ambulatory Visit: Payer: Self-pay | Admitting: Internal Medicine

## 2012-08-01 ENCOUNTER — Other Ambulatory Visit (INDEPENDENT_AMBULATORY_CARE_PROVIDER_SITE_OTHER): Payer: Medicare Other

## 2012-08-01 ENCOUNTER — Ambulatory Visit (INDEPENDENT_AMBULATORY_CARE_PROVIDER_SITE_OTHER): Payer: 59 | Admitting: Internal Medicine

## 2012-08-01 ENCOUNTER — Encounter: Payer: Self-pay | Admitting: Internal Medicine

## 2012-08-01 VITALS — BP 110/68 | HR 81 | Temp 97.3°F

## 2012-08-01 DIAGNOSIS — E119 Type 2 diabetes mellitus without complications: Secondary | ICD-10-CM

## 2012-08-01 DIAGNOSIS — E785 Hyperlipidemia, unspecified: Secondary | ICD-10-CM

## 2012-08-01 DIAGNOSIS — R609 Edema, unspecified: Secondary | ICD-10-CM

## 2012-08-01 DIAGNOSIS — R269 Unspecified abnormalities of gait and mobility: Secondary | ICD-10-CM

## 2012-08-01 DIAGNOSIS — I10 Essential (primary) hypertension: Secondary | ICD-10-CM

## 2012-08-01 LAB — LIPID PANEL
LDL Cholesterol: 83 mg/dL (ref 0–99)
Total CHOL/HDL Ratio: 4
Triglycerides: 134 mg/dL (ref 0.0–149.0)

## 2012-08-01 LAB — BASIC METABOLIC PANEL
Chloride: 101 mEq/L (ref 96–112)
Creatinine, Ser: 0.8 mg/dL (ref 0.4–1.2)
Sodium: 140 mEq/L (ref 135–145)

## 2012-08-01 MED ORDER — FUROSEMIDE 40 MG PO TABS: ORAL_TABLET | ORAL | Status: DC

## 2012-08-01 NOTE — Assessment & Plan Note (Signed)
stable overall by hx and exam, most recent data reviewed with pt, and pt to continue medical treatment as before Lab Results  Component Value Date   LDLCALC 83 08/01/2012

## 2012-08-01 NOTE — Assessment & Plan Note (Signed)
stable overall by hx and exam, most recent data reviewed with pt, and pt to continue medical treatment as before BP Readings from Last 3 Encounters:  08/01/12 110/68  01/28/12 128/62  02/02/11 130/70

## 2012-08-01 NOTE — Assessment & Plan Note (Signed)
Some improved with LLE with change of hct to lasix 80am, RLE still severe;  D/w pt - should not increase lasix further and risk decreased intravascular volume and orthostasis;  To focus on compression stocking, low salt diet, wt loss if possible, and leg elevation

## 2012-08-01 NOTE — Patient Instructions (Addendum)
Take all new medications as prescribed - the furosemide Continue all other medications as before, except stop the HCTZ fluid pill Please go to LAB in the Basement for the blood and/or urine tests to be done today You will be contacted by phone if any changes need to be made immediately.  Otherwise, you will receive a letter about your results with an explanation. You will be contacted regarding the referral for: Physical Therapy Please return in 6 months, or sooner if needed

## 2012-08-01 NOTE — Assessment & Plan Note (Signed)
Has worsened some since last PT tx last yr  - for repeat referral this time for outpt PT eval and tx

## 2012-08-01 NOTE — Assessment & Plan Note (Signed)
stable overall by hx and exam, most recent data reviewed with pt, and pt to continue medical treatment as before Lab Results  Component Value Date   HGBA1C 8.3* 08/01/2012    

## 2012-08-01 NOTE — Progress Notes (Signed)
Subjective:    Patient ID: Amanda Gentry, female    DOB: 06-07-1946, 66 y.o.   MRN: [id number]  HPI  Here to f/u; overall doing ok,  Pt denies chest pain, increased sob or doe, wheezing, orthopnea, PND, increased LE swelling, palpitations, dizziness or syncope.  Pt denies new neurological symptoms such as new headache, or facial or extremity weakness or numbness   Pt denies polydipsia, polyuria, or low sugar symptoms such as weakness or confusion improved with po intake.  Pt states overall good compliance with meds, trying to follow lower cholesterol diet, wt overall stable but little exercise however.  Has persistent RLE massive edema mostly below the knee, and gait has become worse recently with worsening gen'd weakness.   Pt denies fever, wt loss, night sweats, loss of appetite, or other constitutional symptoms  No cellulitis noted today.  Not orthostatic symptoms on lasix 80 in am.   Past Medical History  Diagnosis Date  . ANXIETY 09/23/2007  . AORTIC INSUFFICIENCY 09/23/2007  . DEPRESSION 09/23/2007  . DIABETES MELLITUS, TYPE II 09/23/2007  . GERD 09/23/2007  . HYPERLIPIDEMIA 09/23/2007  . HYPERTENSION 09/23/2007  . HYPERTHYROIDISM 09/23/2007  . INSOMNIA 09/23/2007  . KNEE PAIN, RIGHT, CHRONIC 03/14/2008  . LOW BACK PAIN 09/23/2007  . Morbid obesity 09/23/2007  . OSTEOARTHRITIS 09/23/2007  . PERIPHERAL EDEMA 09/23/2007  . VITAMIN D DEFICIENCY 03/31/2010  . Degenerative arthritis of right knee 01/28/2012   Past Surgical History  Procedure Date  . Abdominal hysterectomy   . Cholecystectomy   . Oophorectomy     reports that she has never smoked. She does not have any smokeless tobacco history on file. She reports that she drinks alcohol. She reports that she does not use illicit drugs. family history includes Hypertension in her other. No Known Allergies Current Outpatient Prescriptions on File Prior to Visit  Medication Sig Dispense Refill  . ALPRAZolam (XANAX) 0.5 MG tablet Take 1 tablet (0.5  mg total) by mouth daily as needed for anxiety.  30 tablet  5  . amLODipine (NORVASC) 10 MG tablet Take 1 tablet (10 mg total) by mouth daily.  90 tablet  3  . aspirin 81 MG tablet Take 81 mg by mouth daily.        . benazepril (LOTENSIN) 40 MG tablet Take 1 tablet (40 mg total) by mouth daily.  90 tablet  3  . diclofenac (VOLTAREN) 75 MG EC tablet take 1 tablet by mouth twice a day if needed  60 tablet  3  . glucose blood (ONE TOUCH TEST STRIPS) test strip Use as directed once daily       . hydrochlorothiazide 25 MG tablet Take 25 mg by mouth daily.        . Lancets MISC Use as directed once daily       . levothyroxine (SYNTHROID, LEVOTHROID) 100 MCG tablet Take 1 tablet (100 mcg total) by mouth daily.  90 tablet  3  . omeprazole (PRILOSEC) 20 MG capsule Take 1 capsule (20 mg total) by mouth daily.  90 capsule  3  . pioglitazone (ACTOS) 45 MG tablet Take 1 tablet (45 mg total) by mouth daily.  90 tablet  3  . predniSONE (DELTASONE) 10 MG tablet 1 tab per day for 7 days  7 tablet  0  . ranitidine (ZANTAC) 150 MG tablet Take 150 mg by mouth daily as needed.        . rosuvastatin (CRESTOR) 20 MG tablet Take 1 tablet (20 mg  total) by mouth daily.  90 tablet  3  . traMADol (ULTRAM) 50 MG tablet Take 1 tablet (50 mg total) by mouth 4 (four) times daily as needed.  120 tablet  5  . DISCONTD: furosemide (LASIX) 40 MG tablet take 1 tablet by mouth twice a day  180 tablet  1  . DISCONTD: escitalopram (LEXAPRO) 10 MG tablet Take 10 mg by mouth daily.          Review of Systems Review of Systems  Constitutional: Negative for diaphoresis and unexpected weight change.  HENT: Negative for tinnitus.   Eyes: Negative for photophobia and visual disturbance.  Respiratory: Negative for choking and stridor.   Gastrointestinal: Negative for vomiting and blood in stool.  Genitourinary: Negative for hematuria and decreased urine volume.  Musculoskeletal: Negative for acute joint swelling  Skin: Negative for  color change and wound.  Neurological: Negative for tremors and numbness.  Psychiatric/Behavioral: Negative for decreased concentration. The patient is not hyperactive.      Objective:   Physical Exam BP 110/68  Pulse 81  Temp 97.3 F (36.3 C) (Oral) Physical Exam  VS noted, obese, not ill appearing, but fatigued Constitutional: Pt appears well-developed and well-nourished.  HENT: Head: Normocephalic.  Right Ear: External ear normal.  Left Ear: External ear normal.  Eyes: Conjunctivae and EOM are normal. Pupils are equal, round, and reactive to light.  Neck: Normal range of motion. Neck supple.  Cardiovascular: Normal rate and regular rhythm.   Pulmonary/Chest: Effort normal and breath sounds normal.  Abd:  Soft, NT, non-distended, + BS Neurological: Pt is alert. Not confused Skin: RLE massive lymphedema below knee 3+, LLE trace to 1+ Psychiatric: Pt behavior is normal. Thought content normal. not depressed affect    Assessment & Plan:

## 2012-08-02 ENCOUNTER — Encounter: Payer: Self-pay | Admitting: Internal Medicine

## 2012-08-02 ENCOUNTER — Other Ambulatory Visit: Payer: Self-pay | Admitting: Internal Medicine

## 2012-08-02 MED ORDER — METFORMIN HCL 500 MG PO TABS: 500.0000 mg | ORAL_TABLET | Freq: Two times a day (BID) | ORAL | Status: DC

## 2012-08-31 ENCOUNTER — Telehealth: Payer: Self-pay | Admitting: Internal Medicine

## 2012-08-31 DIAGNOSIS — R269 Unspecified abnormalities of gait and mobility: Secondary | ICD-10-CM

## 2012-10-25 ENCOUNTER — Encounter: Payer: Self-pay | Admitting: Internal Medicine

## 2012-10-25 ENCOUNTER — Ambulatory Visit (INDEPENDENT_AMBULATORY_CARE_PROVIDER_SITE_OTHER): Payer: 59 | Admitting: Internal Medicine

## 2012-10-25 VITALS — BP 138/60 | HR 98 | Temp 97.8°F

## 2012-10-25 DIAGNOSIS — E119 Type 2 diabetes mellitus without complications: Secondary | ICD-10-CM

## 2012-10-25 DIAGNOSIS — I10 Essential (primary) hypertension: Secondary | ICD-10-CM

## 2012-10-25 DIAGNOSIS — L989 Disorder of the skin and subcutaneous tissue, unspecified: Secondary | ICD-10-CM

## 2012-10-25 MED ORDER — DOXYCYCLINE HYCLATE 100 MG PO TABS: 100.0000 mg | ORAL_TABLET | Freq: Two times a day (BID) | ORAL | Status: DC

## 2012-10-25 MED ORDER — PHENTERMINE HCL 37.5 MG PO CAPS: 37.5000 mg | ORAL_CAPSULE | ORAL | Status: DC

## 2012-10-25 NOTE — Assessment & Plan Note (Signed)
Ok for trial phentermine asd,  to f/u any worsening symptoms or concerns  

## 2012-10-25 NOTE — Patient Instructions (Addendum)
Take all new medications as prescribed - the antibiotic, and phentermine Continue all other medications as before Please try the gauze/paper tape and neosporin to back of leg until healed as well Please have the pharmacy call with any other refills you may need.

## 2012-10-25 NOTE — Assessment & Plan Note (Signed)
stable overall by hx and exam, most recent data reviewed with pt, and pt to continue medical treatment as before BP Readings from Last 3 Encounters:  10/25/12 138/60  08/01/12 110/68  01/28/12 128/62

## 2012-10-25 NOTE — Assessment & Plan Note (Signed)
stable overall by hx and exam, most recent data reviewed with pt, and pt to continue medical treatment as before Lab Results  Component Value Date   HGBA1C 8.3* 08/01/2012

## 2012-10-25 NOTE — Assessment & Plan Note (Signed)
By hx seems chronic but exam seems more acute- for doxy course, consider derm referral

## 2012-10-25 NOTE — Progress Notes (Signed)
Subjective:    Patient ID: Amanda Gentry, female    DOB: Dec 26, 1945, 66 y.o.   MRN: [id number]  HPI  Here with > 1 yr hx of red/tender/swollen area to the right post calf, seemed to start during a hospn and just has not healed, occasinally getting more irritated, swollen and drains slightly.  No fever or red streaks.  + hx of MRSA, overall the area worse in the past 1 wk. Pt denies chest pain, increased sob or doe, wheezing, orthopnea, PND, increased LE swelling, palpitations, dizziness or syncope.  Pt denies new neurological symptoms such as new headache, or facial or extremity weakness or numbness   Pt denies polydipsia, polyuria, or low sugar symptoms such as weakness or confusion improved with po intake.  Pt states overall good compliance with meds, trying to follow lower cholesterol, diabetic diet, wt overall stable but little exercise however, unable to lose wt with better diet Past Medical History  Diagnosis Date  . ANXIETY 09/23/2007  . AORTIC INSUFFICIENCY 09/23/2007  . DEPRESSION 09/23/2007  . DIABETES MELLITUS, TYPE II 09/23/2007  . GERD 09/23/2007  . HYPERLIPIDEMIA 09/23/2007  . HYPERTENSION 09/23/2007  . HYPERTHYROIDISM 09/23/2007  . INSOMNIA 09/23/2007  . KNEE PAIN, RIGHT, CHRONIC 03/14/2008  . LOW BACK PAIN 09/23/2007  . Morbid obesity 09/23/2007  . OSTEOARTHRITIS 09/23/2007  . PERIPHERAL EDEMA 09/23/2007  . VITAMIN D DEFICIENCY 03/31/2010  . Degenerative arthritis of right knee 01/28/2012   Past Surgical History  Procedure Date  . Abdominal hysterectomy   . Cholecystectomy   . Oophorectomy     reports that she has never smoked. She does not have any smokeless tobacco history on file. She reports that she drinks alcohol. She reports that she does not use illicit drugs. family history includes Hypertension in her other. No Known Allergies Current Outpatient Prescriptions on File Prior to Visit  Medication Sig Dispense Refill  . ALPRAZolam (XANAX) 0.5 MG tablet Take 1 tablet (0.5 mg  total) by mouth daily as needed for anxiety.  30 tablet  5  . amLODipine (NORVASC) 10 MG tablet Take 1 tablet (10 mg total) by mouth daily.  90 tablet  3  . aspirin 81 MG tablet Take 81 mg by mouth daily.        . benazepril (LOTENSIN) 40 MG tablet Take 1 tablet (40 mg total) by mouth daily.  90 tablet  3  . diclofenac (VOLTAREN) 75 MG EC tablet take 1 tablet by mouth twice a day if needed  60 tablet  3  . furosemide (LASIX) 40 MG tablet 1-2 tabs in the AM  180 tablet  3  . levothyroxine (SYNTHROID, LEVOTHROID) 100 MCG tablet Take 1 tablet (100 mcg total) by mouth daily.  90 tablet  3  . omeprazole (PRILOSEC) 20 MG capsule Take 1 capsule (20 mg total) by mouth daily.  90 capsule  3  . pioglitazone (ACTOS) 45 MG tablet Take 1 tablet (45 mg total) by mouth daily.  90 tablet  3  . rosuvastatin (CRESTOR) 20 MG tablet Take 1 tablet (20 mg total) by mouth daily.  90 tablet  3  . traMADol (ULTRAM) 50 MG tablet Take 1 tablet (50 mg total) by mouth 4 (four) times daily as needed.  120 tablet  5  . glucose blood (ONE TOUCH TEST STRIPS) test strip Use as directed once daily       . hydrochlorothiazide 25 MG tablet Take 25 mg by mouth daily.        Marland Kitchen  Lancets MISC Use as directed once daily       . metFORMIN (GLUCOPHAGE) 500 MG tablet Take 1 tablet (500 mg total) by mouth 2 (two) times daily with a meal.  60 tablet  11  . phentermine 37.5 MG capsule Take 1 capsule (37.5 mg total) by mouth every morning.  30 capsule  2  . predniSONE (DELTASONE) 10 MG tablet 1 tab per day for 7 days  7 tablet  0  . [DISCONTINUED] escitalopram (LEXAPRO) 10 MG tablet Take 10 mg by mouth daily.         Review of Systems  Constitutional: Negative for diaphoresis and unexpected weight change.  HENT: Negative for tinnitus.   Eyes: Negative for photophobia and visual disturbance.  Respiratory: Negative for choking and stridor.   Gastrointestinal: Negative for vomiting and blood in stool.  Genitourinary: Negative for hematuria and  decreased urine volume.  Musculoskeletal: Negative for gait problem.  Skin: Negative for color change and wound. except for the above Neurological: Negative for tremors and numbness.  Psychiatric/Behavioral: Negative for decreased concentration. The patient is not hyperactive.       Objective:   Physical Exam BP 138/60  Pulse 98  Temp 97.8 F (36.6 C) (Oral)  SpO2 92% Physical Exam  VS noted - afeb Constitutional: Pt appears well-developed and well-nourished. /morbid obese HENT: Head: Normocephalic.  Right Ear: External ear normal.  Left Ear: External ear normal.  Eyes: Conjunctivae and EOM are normal. Pupils are equal, round, and reactive to light.  Neck: Normal range of motion. Neck supple.  Cardiovascular: Normal rate and regular rhythm.   Pulmonary/Chest: Effort normal and breath sounds normal.  Neurological: Pt is alert. Not confused  Skin: Skin is warm. No erythema. right post calf with chronic 2+ edema, and mid post calf erythem tender area without flutuance or drainage, approx 1 cmm, without red streaks Psychiatric: Pt behavior is normal. Thought content normal.     Assessment & Plan:

## 2012-11-30 ENCOUNTER — Other Ambulatory Visit: Payer: Self-pay | Admitting: Internal Medicine

## 2012-11-30 NOTE — Telephone Encounter (Signed)
Faxed hardcopy to pharmacy. 

## 2012-11-30 NOTE — Telephone Encounter (Signed)
Done hardcopy to robin  

## 2013-01-08 ENCOUNTER — Other Ambulatory Visit: Payer: Self-pay | Admitting: Internal Medicine

## 2013-01-26 ENCOUNTER — Other Ambulatory Visit: Payer: Self-pay | Admitting: Internal Medicine

## 2013-01-28 ENCOUNTER — Other Ambulatory Visit: Payer: Self-pay | Admitting: Internal Medicine

## 2013-01-30 ENCOUNTER — Ambulatory Visit: Payer: Medicare Other | Admitting: Internal Medicine

## 2013-02-11 ENCOUNTER — Other Ambulatory Visit: Payer: Self-pay | Admitting: Internal Medicine

## 2013-06-13 ENCOUNTER — Other Ambulatory Visit: Payer: Self-pay | Admitting: Internal Medicine

## 2013-06-14 NOTE — Telephone Encounter (Signed)
Faxed script back to [facility]...lmb 

## 2013-06-14 NOTE — Telephone Encounter (Signed)
MD out of office. Pls advise.../lmb 

## 2013-08-20 ENCOUNTER — Other Ambulatory Visit: Payer: Self-pay | Admitting: Internal Medicine

## 2013-09-11 ENCOUNTER — Other Ambulatory Visit: Payer: Self-pay | Admitting: Internal Medicine

## 2013-10-23 ENCOUNTER — Other Ambulatory Visit (INDEPENDENT_AMBULATORY_CARE_PROVIDER_SITE_OTHER): Payer: Medicare (Managed Care)

## 2013-10-23 ENCOUNTER — Encounter: Payer: Self-pay | Admitting: Internal Medicine

## 2013-10-23 ENCOUNTER — Ambulatory Visit (INDEPENDENT_AMBULATORY_CARE_PROVIDER_SITE_OTHER): Payer: Medicare (Managed Care) | Admitting: Internal Medicine

## 2013-10-23 VITALS — BP 122/70 | HR 92 | Temp 97.0°F

## 2013-10-23 DIAGNOSIS — F329 Major depressive disorder, single episode, unspecified: Secondary | ICD-10-CM

## 2013-10-23 DIAGNOSIS — E119 Type 2 diabetes mellitus without complications: Secondary | ICD-10-CM

## 2013-10-23 DIAGNOSIS — R5381 Other malaise: Secondary | ICD-10-CM

## 2013-10-23 DIAGNOSIS — E785 Hyperlipidemia, unspecified: Secondary | ICD-10-CM

## 2013-10-23 DIAGNOSIS — R5383 Other fatigue: Secondary | ICD-10-CM

## 2013-10-23 DIAGNOSIS — Z23 Encounter for immunization: Secondary | ICD-10-CM

## 2013-10-23 LAB — URINALYSIS, ROUTINE W REFLEX MICROSCOPIC
Bilirubin Urine: NEGATIVE
Hgb urine dipstick: NEGATIVE
Ketones, ur: NEGATIVE
Leukocytes, UA: NEGATIVE
Nitrite: NEGATIVE
Specific Gravity, Urine: 1.015 (ref 1.000–1.030)
Urobilinogen, UA: 0.2 (ref 0.0–1.0)
pH: 7.5 (ref 5.0–8.0)

## 2013-10-23 LAB — CBC WITH DIFFERENTIAL/PLATELET
Basophils Absolute: 0 10*3/uL (ref 0.0–0.1)
Basophils Relative: 0.2 % (ref 0.0–3.0)
Eosinophils Absolute: 0.1 10*3/uL (ref 0.0–0.7)
Lymphocytes Relative: 15.7 % (ref 12.0–46.0)
MCHC: 33.3 g/dL (ref 30.0–36.0)
MCV: 83.8 fl (ref 78.0–100.0)
Monocytes Absolute: 0.3 10*3/uL (ref 0.1–1.0)
Neutrophils Relative %: 77.7 % — ABNORMAL HIGH (ref 43.0–77.0)
Platelets: 183 10*3/uL (ref 150.0–400.0)
RBC: 4.68 Mil/uL (ref 3.87–5.11)

## 2013-10-23 LAB — LIPID PANEL
Cholesterol: 138 mg/dL (ref 0–200)
HDL: 40.1 mg/dL (ref >39.00)
LDL Cholesterol: 77 mg/dL (ref 0–99)
Total CHOL/HDL Ratio: 3
Triglycerides: 104 mg/dL (ref 0.0–149.0)
VLDL: 20.8 mg/dL (ref 0.0–40.0)

## 2013-10-23 LAB — HEPATIC FUNCTION PANEL
ALT: 13 U/L (ref 0–35)
AST: 18 U/L (ref 0–37)
Albumin: 3.8 g/dL (ref 3.5–5.2)
Bilirubin, Direct: 0.1 mg/dL (ref 0.0–0.3)
Total Bilirubin: 0.5 mg/dL (ref 0.3–1.2)
Total Protein: 7.9 g/dL (ref 6.0–8.3)

## 2013-10-23 LAB — BASIC METABOLIC PANEL
BUN: 18 mg/dL (ref 6–23)
CO2: 25 mEq/L (ref 19–32)
Chloride: 104 mEq/L (ref 96–112)
Creatinine, Ser: 0.8 mg/dL (ref 0.4–1.2)
Glucose, Bld: 111 mg/dL — ABNORMAL HIGH (ref 70–99)
Potassium: 4.4 mEq/L (ref 3.5–5.1)

## 2013-10-23 LAB — TSH: TSH: 1.45 u[IU]/mL (ref 0.35–5.50)

## 2013-10-23 LAB — MICROALBUMIN / CREATININE URINE RATIO: Microalb, Ur: 4.6 mg/dL — ABNORMAL HIGH (ref 0.0–1.9)

## 2013-10-23 LAB — HEMOGLOBIN A1C: Hgb A1c MFr Bld: 7.1 % — ABNORMAL HIGH (ref 4.6–6.5)

## 2013-10-23 MED ORDER — ROSUVASTATIN CALCIUM 20 MG PO TABS: 20.0000 mg | ORAL_TABLET | Freq: Every day | ORAL | Status: DC

## 2013-10-23 MED ORDER — BENAZEPRIL HCL 40 MG PO TABS: 40.0000 mg | ORAL_TABLET | Freq: Every day | ORAL | Status: DC

## 2013-10-23 MED ORDER — OMEPRAZOLE 20 MG PO CPDR: 20.0000 mg | DELAYED_RELEASE_CAPSULE | Freq: Every day | ORAL | Status: DC

## 2013-10-23 MED ORDER — AMLODIPINE BESYLATE 10 MG PO TABS: 10.0000 mg | ORAL_TABLET | Freq: Every day | ORAL | Status: DC

## 2013-10-23 MED ORDER — GLUCOSE BLOOD VI STRP: ORAL_STRIP | Status: DC

## 2013-10-23 MED ORDER — PIOGLITAZONE HCL 45 MG PO TABS: 45.0000 mg | ORAL_TABLET | Freq: Every day | ORAL | Status: DC

## 2013-10-23 MED ORDER — ALPRAZOLAM 0.5 MG PO TABS: ORAL_TABLET | ORAL | Status: DC

## 2013-10-23 MED ORDER — FUROSEMIDE 40 MG PO TABS: ORAL_TABLET | ORAL | Status: DC

## 2013-10-23 MED ORDER — HYDROCHLOROTHIAZIDE 25 MG PO TABS: 25.0000 mg | ORAL_TABLET | Freq: Every day | ORAL | Status: DC

## 2013-10-23 MED ORDER — LEVOTHYROXINE SODIUM 100 MCG PO TABS: 100.0000 ug | ORAL_TABLET | Freq: Every day | ORAL | Status: DC

## 2013-10-23 MED ORDER — METFORMIN HCL 500 MG PO TABS: 500.0000 mg | ORAL_TABLET | Freq: Two times a day (BID) | ORAL | Status: DC

## 2013-10-23 MED ORDER — ESCITALOPRAM OXALATE 10 MG PO TABS: 10.0000 mg | ORAL_TABLET | Freq: Every day | ORAL | Status: DC

## 2013-10-23 NOTE — Assessment & Plan Note (Signed)
stable overall by history and exam, recent data reviewed with pt, and pt to continue medical treatment as before,  to f/u any worsening symptoms or concerns Lab Results  Component Value Date   LDLCALC 83 08/01/2012

## 2013-10-23 NOTE — Patient Instructions (Addendum)
Please take all new medication as prescribed - the lexapro 10 mg Please continue all other medications as before, and refills have been done if requested. Please have the pharmacy call with any other refills you may need.  Please remember to followup with your GYN for the yearly pap smear and/or mammogram Please check with your insurance about the copay for the shingles shot  (and you have a prescription today) You had the flu shot today We can do the new Pneumonia shot (Prevnar shot) next visit  Please go to the LAB in the Basement (turn left off the elevator) for the tests to be done today You will be contacted by phone if any changes need to be made immediately.  Otherwise, you will receive a letter about your results with an explanation, but please check with MyChart first.  Please remember to sign up for My Chart if you have not done so, as this will be important to you in the future with finding out test results, communicating by private email, and scheduling acute appointments online when needed.  Please return in 6 months, or sooner if needed

## 2013-10-23 NOTE — Assessment & Plan Note (Signed)
Ok for lexapro 10 qd,  to f/u any worsening symptoms or concerns  

## 2013-10-23 NOTE — Addendum Note (Signed)
Addended by: Scharlene Gloss B on: 10/23/2013 03:26 PM   Modules accepted: Orders

## 2013-10-23 NOTE — Progress Notes (Signed)
Subjective:    Patient ID: Amanda Gentry, female    DOB: 1946-10-04, 67 y.o.   MRN: [id number]  HPI  Here for yearly f/u;  Overall doing ok;  Pt denies CP, worsening SOB, DOE, wheezing, orthopnea, PND, worsening LE edema, palpitations, dizziness or syncope.  Pt denies neurological change such as new headache, facial or extremity weakness.  Pt denies polydipsia, polyuria, or low sugar symptoms. Pt states overall good compliance with treatment and medications, good tolerability, and has been trying to follow lower cholesterol diet.  Pt had had mild worsening depressive symptoms with low mood and tearfulness but no suicidal ideation or panic. No fever, night sweats, wt loss, loss of appetite, or other constitutional symptoms.  Pt states good ability with ADL's, has low fall risk, home safety reviewed and adequate, no other significant changes in hearing or vision, and only occasionally active with exercise. Does c/o ongoing fatigue, but denies signficant daytime hypersomnolence.  Past Medical History  Diagnosis Date  . ANXIETY 09/23/2007  . AORTIC INSUFFICIENCY 09/23/2007  . DEPRESSION 09/23/2007  . DIABETES MELLITUS, TYPE II 09/23/2007  . GERD 09/23/2007  . HYPERLIPIDEMIA 09/23/2007  . HYPERTENSION 09/23/2007  . HYPERTHYROIDISM 09/23/2007  . INSOMNIA 09/23/2007  . KNEE PAIN, RIGHT, CHRONIC 03/14/2008  . LOW BACK PAIN 09/23/2007  . Morbid obesity 09/23/2007  . OSTEOARTHRITIS 09/23/2007  . PERIPHERAL EDEMA 09/23/2007  . VITAMIN D DEFICIENCY 03/31/2010  . Degenerative arthritis of right knee 01/28/2012   Past Surgical History  Procedure Laterality Date  . Abdominal hysterectomy    . Cholecystectomy    . Oophorectomy      reports that she has never smoked. She does not have any smokeless tobacco history on file. She reports that she drinks alcohol. She reports that she does not use illicit drugs. family history includes Hypertension in her other. No Known Allergies Current Outpatient Prescriptions on  File Prior to Visit  Medication Sig Dispense Refill  . aspirin 81 MG tablet Take 81 mg by mouth daily.        . diclofenac (VOLTAREN) 75 MG EC tablet take 1 tablet by mouth twice a day if needed  60 tablet  3  . Lancets MISC Use as directed once daily       . traMADol (ULTRAM) 50 MG tablet Take 1 tablet (50 mg total) by mouth 4 (four) times daily as needed.  120 tablet  5   No current facility-administered medications on file prior to visit.   Review of Systems  Constitutional: Negative for unexpected weight change, or unusual diaphoresis  HENT: Negative for tinnitus.   Eyes: Negative for photophobia and visual disturbance.  Respiratory: Negative for choking and stridor.   Gastrointestinal: Negative for vomiting and blood in stool.  Genitourinary: Negative for hematuria and decreased urine volume.  Musculoskeletal: Negative for acute joint swelling Skin: Negative for color change and wound.  Neurological: Negative for tremors and numbness other than noted  Psychiatric/Behavioral: Negative for decreased concentration or  hyperactivity.       Objective:   Physical Exam BP 122/70  Pulse 92  Temp(Src) 97 F (36.1 C) (Oral) VS noted,  Constitutional: Pt appears well-developed and well-nourished.  HENT: Head: NCAT.  Right Ear: External ear normal.  Left Ear: External ear normal.  Eyes: Conjunctivae and EOM are normal. Pupils are equal, round, and reactive to light.  Neck: Normal range of motion. Neck supple.  Cardiovascular: Normal rate and regular rhythm.   Pulmonary/Chest: Effort normal and breath sounds  normal.  Abd:  Soft, NT, non-distended, + BS Neurological: Pt is alert. Not confused  Skin: 2-3+ edema LE right > left Psychiatric: Pt behavior is normal. Thought content normal. with depressed affect, nervous    Assessment & Plan:

## 2013-10-23 NOTE — Assessment & Plan Note (Signed)
stable overall by history and exam, recent data reviewed with pt, and pt to continue medical treatment as before,  to f/u any worsening symptoms or concerns Lab Results  Component Value Date   HGBA1C 8.3* 08/01/2012

## 2013-10-23 NOTE — Assessment & Plan Note (Addendum)
Etiology unclear, Exam otherwise benign, to check labs as documented, follow with expectant management  Note:  Total time for pt hx, exam, review of record with pt in the room, determination of diagnoses and plan for further eval and tx is > 40 min, with over 50% spent in coordination and counseling of patient  

## 2013-10-23 NOTE — Progress Notes (Signed)
Pre-visit discussion using our clinic review tool. No additional management support is needed unless otherwise documented below in the visit note.  

## 2013-10-28 ENCOUNTER — Other Ambulatory Visit: Payer: Self-pay | Admitting: Internal Medicine

## 2014-02-07 ENCOUNTER — Telehealth: Payer: Self-pay | Admitting: Internal Medicine

## 2014-02-07 MED ORDER — AZITHROMYCIN 250 MG PO TABS: ORAL_TABLET | ORAL | Status: DC

## 2014-02-07 NOTE — Telephone Encounter (Signed)
Pt request for Dr. Jenny Reichmann to give her something for cold. Offer an appt but pt stated it is difficult for her to come in because she is in the weelchair. Please advise.

## 2014-02-07 NOTE — Telephone Encounter (Signed)
Antibiotic - done erx  You can also take Delsym OTC for cough, and/or Mucinex (or it's generic off brand) for congestion, and tylenol as needed for pain.  Needs OV if worsening fever, pain, weakness, dizziness, or other unusual symptoms

## 2014-02-07 NOTE — Telephone Encounter (Signed)
Robin to see last note

## 2014-02-08 NOTE — Telephone Encounter (Signed)
Patient returned call.  Informed rx sent in and MD instructions.

## 2014-02-08 NOTE — Telephone Encounter (Signed)
Called left message to call back 

## 2014-02-11 ENCOUNTER — Telehealth: Payer: Self-pay | Admitting: *Deleted

## 2014-02-11 NOTE — Telephone Encounter (Signed)
[facility] Triage Call Report Triage Record Num: [id number] Operator: Wells Guiles Patient Name: Amanda Gentry Call Date & Time: 02/08/2014 8:22:48AM Patient Phone: [phone] PCP: Cathlean Cower Patient Gender: Female PCP Fax : [phone] Patient DOB: [birth year] Practice Name[facility] Reason for Call: Calling about starting with congestion and cough on Tuesday 02/05/14 and has been coughing up greenish sputum for past few days. She is wheeing but only short of breath with activities. Uses wheelchair-LARGE SIZED for arthritis in R leg and is having trouble getting to the bathroom on time to void. Afebrile. Using honey for cough and helps some. Has not tried any other OTC medications. Triage per Cough Protocol and appointmetn advised today but unable to get into the office on her own. Will call back today to set up appoinment for 02/09/14 after scheduling screen opened. Called back and MD was able to call in medication for her. Advised to call back if sx not improving in a few days. Protocol(s) Used: Cough - Adult Recommended Outcome per Protocol: See Provider within 24 hours Reason for Outcome: Productive cough with colored sputum (other than clear or white sputum) Care Advice: ~ Use a cool mist humidifier to moisten air. Be sure to clean according to manufacturer's instructions. Limit or avoid exposure to irritants and allergens (e.g. air pollution, smoke/smoking, chemicals, dust, pollen, pet dander, etc.) ~ Call provider if fever greater than 101.5 F (38.6 C) or 100.5 F (38.1C) in an immunocompromised patient (such as diabetes, HIV/AIDS, renal disease, chemotherapy, organ transplant, or chronic steroid use) has not improved in 24 hours. ~ ~ If you can, stop smoking now and avoid all secondhand smoke. Drink more fluids -- water, low-sugar juices, tea and warm soup, especially chicken broth, are options. Avoid caffeinated or alcoholic beverages because they can increase  the chance of dehydration. ~ ~ SYMPTOM / CONDITION MANAGEMENT ~ CAUTIONS Coughing up mucus or phlegm helps to get rid of an infection. A productive cough should not be stopped. A cough medicine with guaifenesin (Robitussin, Mucinex) can help loosen the mucus. Cough medicine with dextromethorphan (DM) should be avoided. Drinking lots of fluids can help loosen the mucus too, especially warm fluids. ~ Total water intake includes drinking water, water in beverages, and water contained in food. Fluids make up about 80% of the body's total hydration need. Individual fluid requirement to maintain hydration vary based on physical activity, environmental factors and illness. Limit fluids that contain sugar, caffeine, or alcohol. Urine will be very light yellow color when you drink enough fluids.

## 2014-02-11 NOTE — Telephone Encounter (Signed)
[facility] Triage Call Report Triage Record Num: [id number] Operator: Veda Canning Patient Name: Amanda Gentry Call Date & Time: 02/08/2014 5:36:59AM Patient Phone: [phone] PCP: Cathlean Cower Patient Gender: Female PCP Fax : [phone] Patient DOB: [birth year] Practice Name[facility] Reason for Call: Caller: [name]/Patient; PCP: Cathlean Cower (Adults only); CB#: [phone]; Call regarding Wheezing; Onset3/16/15 cold S&S, wheezing now. Afebrile. Productive yellow green, no blood noted. Emergent S&S ruled out per Cough Adult:productive cough with colored sputum." Advised to be evaluated with in 24hrs. Pt states she will call office today for appt. Caller requested z pack for her cough. Advised I do not have orders for this. States she will need someone with a wheel chair to help her get into the office. Advised she will have to talk to the office this morning about this also. Verbalized understanding. Protocol(s) Used: Cough - Adult Recommended Outcome per Protocol: See Provider within 24 hours Reason for Outcome: Productive cough with colored sputum (other than clear or white sputum) Care Advice: ~ Use a cool mist humidifier to moisten air. Be sure to clean according to manufacturer's instructions. Limit or avoid exposure to irritants and allergens (e.g. air pollution, smoke/smoking, chemicals, dust, pollen, pet dander, etc.) ~ Call provider if fever greater than 101.5 F (38.6 C) or 100.5 F (38.1C) in an immunocompromised patient (such as diabetes, HIV/AIDS, renal disease, chemotherapy, organ transplant, or chronic steroid use) has not improved in 24 hours. ~ ~ If you can, stop smoking now and avoid all secondhand smoke. Drink more fluids -- water, low-sugar juices, tea and warm soup, especially chicken broth, are options. Avoid caffeinated or alcoholic beverages because they can increase the chance of dehydration. ~ Coughing up mucus or phlegm helps to get rid of  an infection. A productive cough should not be stopped. A cough medicine with guaifenesin (Robitussin, Mucinex) can help loosen the mucus. Cough medicine with dextromethorphan (DM) should be avoided. Drinking lots of fluids can help loosen the mucus too, especially warm fluids. ~ Total water intake includes drinking water, water in beverages, and water contained in food. Fluids make up about 80% of the body's total hydration need. Individual fluid requirement to maintain hydration vary based on physical activity, environmental factors and illness. Limit fluids that contain sugar, caffeine, or alcohol. Urine will be very light yellow color when you drink enough fluids. ~ 02/08/2014 5:54:37AM Page 1 of 1 [id number]

## 2014-02-15 ENCOUNTER — Telehealth: Payer: Self-pay

## 2014-02-15 NOTE — Telephone Encounter (Signed)
Phone call from patient requesting a note stating she can not walk far without assistance from a wheelchair of walker and also stating she has a hard time breathing. She states she needs this note for a seminar she's to attend. Please advise.   Cb# [phone]

## 2014-02-15 NOTE — Telephone Encounter (Signed)
I am not sure about this as I did not document any complaints of this at her last visit, and did not document the needs for support with walking.  I'm guessing she is talking about walking several hundred yards such as from a parking lot into a large building, and asking for note like this to be able to park in handicap parking and maybe even be assisted with a motorized vehicle such as they use in airports?  We would need more information about the distance she is talking about walking, and to whom the letter would need to be addressed if appropriate?  Please confirm

## 2014-02-19 NOTE — Telephone Encounter (Signed)
Patient states she will call back tomorrow to schedule an appointment

## 2014-02-19 NOTE — Telephone Encounter (Signed)
I called patient back and she states she can not walk from the den to her kitchen without trouble and breathing problems. I asked her whom the letter should be addressed to and she states to whom it may concern.

## 2014-02-19 NOTE — Telephone Encounter (Signed)
I think needs OV as I dont see where this has been a signficant issue in past

## 2014-02-21 ENCOUNTER — Telehealth: Payer: Self-pay | Admitting: *Deleted

## 2014-02-21 NOTE — Telephone Encounter (Signed)
Pt called requesting a letter stating she is unable to walk long distances and that she has breathing difficulty.  Pt needs letter by Monday.  Please advise

## 2014-02-21 NOTE — Telephone Encounter (Signed)
Spoke with pt advised of MDs message.  At pts request put on waitlist for Tuesday 4.7.15

## 2014-02-21 NOTE — Telephone Encounter (Signed)
I decline, needs OV for evaluation as I dont see why this should be the case based on her illness list

## 2014-02-27 ENCOUNTER — Encounter: Payer: Self-pay | Admitting: Internal Medicine

## 2014-02-27 ENCOUNTER — Ambulatory Visit (INDEPENDENT_AMBULATORY_CARE_PROVIDER_SITE_OTHER): Payer: Medicare (Managed Care) | Admitting: Internal Medicine

## 2014-02-27 VITALS — BP 132/70 | HR 96 | Temp 97.5°F

## 2014-02-27 DIAGNOSIS — J209 Acute bronchitis, unspecified: Secondary | ICD-10-CM | POA: Insufficient documentation

## 2014-02-27 DIAGNOSIS — I10 Essential (primary) hypertension: Secondary | ICD-10-CM

## 2014-02-27 DIAGNOSIS — R062 Wheezing: Secondary | ICD-10-CM | POA: Insufficient documentation

## 2014-02-27 MED ORDER — METHYLPREDNISOLONE ACETATE 80 MG/ML IJ SUSP
80.0000 mg | Freq: Once | INTRAMUSCULAR | Status: AC
  Administered 2014-02-27: 80 mg via INTRAMUSCULAR

## 2014-02-27 MED ORDER — HYDROCODONE-HOMATROPINE 5-1.5 MG/5ML PO SYRP: 5.0000 mL | ORAL_SOLUTION | Freq: Four times a day (QID) | ORAL | Status: DC | PRN

## 2014-02-27 MED ORDER — PREDNISONE 10 MG PO TABS: ORAL_TABLET | ORAL | Status: DC

## 2014-02-27 NOTE — Assessment & Plan Note (Signed)
stable overall by history and exam, recent data reviewed with pt, and pt to continue medical treatment as before,  to f/u any worsening symptoms or concerns BP Readings from Last 3 Encounters:  02/27/14 132/70  10/23/13 122/70  10/25/12 138/60

## 2014-02-27 NOTE — Patient Instructions (Signed)
You had the steroid shot today  Please take all new medication as prescribed - the prednisone, and the cough medicine  Please continue all other medications as before, and refills have been done if requested. Please have the pharmacy call with any other refills you may need.  Please continue your efforts at being more active, low cholesterol diet, and weight control.

## 2014-02-27 NOTE — Assessment & Plan Note (Signed)
Mild to mod, for depomedrol IM, predpack asd,  to f/u any worsening symptoms or concerns 

## 2014-02-27 NOTE — Progress Notes (Signed)
Subjective:    Patient ID: Amanda Gentry, female    DOB: 04-29-1946, 68 y.o.   MRN: [id number]  HPI  Here with acute onset mild to mod 2-3 wks ST, HA, general weakness and malaise, with prod cough greenish sputum, but Pt denies chest pain, increased sob or doe, wheezing, orthopnea, PND, increased LE swelling, palpitations, dizziness or syncope. Better recnetly with zpack, but still with onset 1 wk wheezing/sob ongoing.  Pt denies polydipsia, polyuria.  Pt denies new neurological symptoms such as new headache, or facial or extremity weakness or numbness Past Medical History  Diagnosis Date  . ANXIETY 09/23/2007  . AORTIC INSUFFICIENCY 09/23/2007  . DEPRESSION 09/23/2007  . DIABETES MELLITUS, TYPE II 09/23/2007  . GERD 09/23/2007  . HYPERLIPIDEMIA 09/23/2007  . HYPERTENSION 09/23/2007  . HYPERTHYROIDISM 09/23/2007  . INSOMNIA 09/23/2007  . KNEE PAIN, RIGHT, CHRONIC 03/14/2008  . LOW BACK PAIN 09/23/2007  . Morbid obesity 09/23/2007  . OSTEOARTHRITIS 09/23/2007  . PERIPHERAL EDEMA 09/23/2007  . VITAMIN D DEFICIENCY 03/31/2010  . Degenerative arthritis of right knee 01/28/2012   Past Surgical History  Procedure Laterality Date  . Abdominal hysterectomy    . Cholecystectomy    . Oophorectomy      reports that she has never smoked. She does not have any smokeless tobacco history on file. She reports that she drinks alcohol. She reports that she does not use illicit drugs. family history includes Hypertension in her other. No Known Allergies Current Outpatient Prescriptions on File Prior to Visit  Medication Sig Dispense Refill  . ALPRAZolam (XANAX) 0.5 MG tablet take 1 tablet by mouth daily if needed for anxiety  30 tablet  5  . amLODipine (NORVASC) 10 MG tablet Take 1 tablet (10 mg total) by mouth daily.  90 tablet  3  . aspirin 81 MG tablet Take 81 mg by mouth daily.        . benazepril (LOTENSIN) 40 MG tablet Take 1 tablet (40 mg total) by mouth daily.  90 tablet  3  . benazepril (LOTENSIN) 40  MG tablet take 1 tablet by mouth once daily  90 tablet  2  . diclofenac (VOLTAREN) 75 MG EC tablet take 1 tablet by mouth twice a day if needed  60 tablet  3  . furosemide (LASIX) 40 MG tablet 1-2 tabs in the AM  180 tablet  3  . glucose blood (ONE TOUCH TEST STRIPS) test strip Use as directed once daily  100 each  11  . hydrochlorothiazide (HYDRODIURIL) 25 MG tablet Take 1 tablet (25 mg total) by mouth daily.  90 tablet  3  . Lancets MISC Use as directed once daily       . levothyroxine (SYNTHROID, LEVOTHROID) 100 MCG tablet Take 1 tablet (100 mcg total) by mouth daily.  90 tablet  3  . levothyroxine (SYNTHROID, LEVOTHROID) 100 MCG tablet take 1 tablet by mouth once daily  90 tablet  2  . metFORMIN (GLUCOPHAGE) 500 MG tablet Take 1 tablet (500 mg total) by mouth 2 (two) times daily with a meal.  180 tablet  3  . omeprazole (PRILOSEC) 20 MG capsule Take 1 capsule (20 mg total) by mouth daily.  90 capsule  3  . omeprazole (PRILOSEC) 20 MG capsule take 1 capsule by mouth once daily  90 capsule  2  . pioglitazone (ACTOS) 45 MG tablet Take 1 tablet (45 mg total) by mouth daily.  90 tablet  3  . rosuvastatin (CRESTOR) 20 MG  tablet Take 1 tablet (20 mg total) by mouth daily.  30 tablet  11  . traMADol (ULTRAM) 50 MG tablet Take 1 tablet (50 mg total) by mouth 4 (four) times daily as needed.  120 tablet  5  . escitalopram (LEXAPRO) 10 MG tablet Take 1 tablet (10 mg total) by mouth daily.  90 tablet  3   No current facility-administered medications on file prior to visit.   Review of Systems  Constitutional: Negative for unexpected weight change, or unusual diaphoresis  HENT: Negative for tinnitus.   Eyes: Negative for photophobia and visual disturbance.  Respiratory: Negative for choking and stridor.   Gastrointestinal: Negative for vomiting and blood in stool.  Genitourinary: Negative for hematuria and decreased urine volume.  Musculoskeletal: Negative for acute joint swelling Skin: Negative for  color change and wound.  Neurological: Negative for tremors and numbness other than noted  Psychiatric/Behavioral: Negative for decreased concentration or  hyperactivity.       Objective:   Physical Exam BP 132/70  Pulse 96  Temp(Src) 97.5 F (36.4 C) (Oral)  SpO2 94% VS noted, mild ill Constitutional: Pt appears well-developed and well-nourished.  HENT: Head: NCAT.  Right Ear: External ear normal.  Left Ear: External ear normal.  Bilat tm's with mild erythema.  Max sinus areas mild tender.  Pharynx with mild erythema, no exudate Eyes: Conjunctivae and EOM are normal. Pupils are equal, round, and reactive to light.  Neck: Normal range of motion. Neck supple.  Cardiovascular: Normal rate and regular rhythm.   Pulmonary/Chest: Effort normal and breath sounds decreased with bilat few wheezes.  Neurological: Pt is alert. Not confused  Skin: Skin is warm. No erythema.  Psychiatric: Pt behavior is normal. Thought content normal.     Assessment & Plan:

## 2014-02-27 NOTE — Assessment & Plan Note (Signed)
Mild to mod, s/p antibx course,  to f/u any worsening symptoms or concerns

## 2014-02-27 NOTE — Progress Notes (Signed)
Pre visit review using our clinic review tool, if applicable. No additional management support is needed unless otherwise documented below in the visit note. 

## 2014-02-28 ENCOUNTER — Telehealth: Payer: Self-pay

## 2014-02-28 NOTE — Telephone Encounter (Signed)
The patient called this morning statiing the class note written last night was not exactly what was needed.  She needs the note to state she is physically unable (permanently) to appear in court and class.  The letter is to be addressed to whom it may concern, attn:  [employer] [employer] and fax to [phone].  Please advise if ok to do for the patient.  She stated she was not clear last night explaining what the need of this letter was for.

## 2014-02-28 NOTE — Telephone Encounter (Signed)
I dont feel comfortable with this as I dont feel this would be true.  With the wheezing improved, she should be able to go to class, and even court (I think she is referring to legal proceedings in a courtroom).

## 2014-02-28 NOTE — Telephone Encounter (Signed)
Called left message to call back 

## 2014-02-28 NOTE — Telephone Encounter (Signed)
Patient informed. 

## 2014-04-27 ENCOUNTER — Other Ambulatory Visit: Payer: Self-pay | Admitting: Internal Medicine

## 2014-05-28 ENCOUNTER — Telehealth: Payer: Self-pay

## 2014-05-28 MED ORDER — CEPHALEXIN 500 MG PO CAPS: 500.0000 mg | ORAL_CAPSULE | Freq: Four times a day (QID) | ORAL | Status: DC

## 2014-05-28 NOTE — Telephone Encounter (Signed)
Pt called in again about rx for antibiotic

## 2014-05-28 NOTE — Telephone Encounter (Signed)
Pt called in and stated that she thinks she has a UTI.   She has no help in the immediate future and states that coming in is impossible due to her handicap. Would like for a rx antibiotic to be called in.  Please advise.

## 2014-05-28 NOTE — Telephone Encounter (Signed)
Called the patient informed rx sent in and to schedule OV if worsens or gets no better.

## 2014-05-28 NOTE — Telephone Encounter (Signed)
Ok this time only, consider OV if not better or any worse

## 2014-06-16 ENCOUNTER — Other Ambulatory Visit: Payer: Self-pay | Admitting: Internal Medicine

## 2014-06-18 NOTE — Telephone Encounter (Signed)
Done hardcopy to robin  

## 2014-06-18 NOTE — Telephone Encounter (Signed)
Faxed hardcopy of Tramadol and Alprazolam to [facility]

## 2014-07-16 ENCOUNTER — Telehealth: Payer: Self-pay

## 2014-07-16 MED ORDER — ATORVASTATIN CALCIUM 80 MG PO TABS: 80.0000 mg | ORAL_TABLET | Freq: Every day | ORAL | Status: DC

## 2014-07-16 NOTE — Telephone Encounter (Signed)
Ok - done erx  Amanda Gentry to let pt know

## 2014-07-16 NOTE — Telephone Encounter (Signed)
Pharm called an requested pt's crestor 20 mg be changed to atorvastatin 80 mg, pt states that crestor 20 mg is too expensive, please advise

## 2014-07-17 NOTE — Telephone Encounter (Signed)
Called the patient left a detailed message of change.

## 2014-08-28 ENCOUNTER — Encounter: Payer: Self-pay | Admitting: Internal Medicine

## 2014-08-28 ENCOUNTER — Ambulatory Visit (INDEPENDENT_AMBULATORY_CARE_PROVIDER_SITE_OTHER): Payer: Medicare (Managed Care) | Admitting: Internal Medicine

## 2014-08-28 VITALS — BP 132/64 | HR 89 | Temp 98.2°F

## 2014-08-28 DIAGNOSIS — R062 Wheezing: Secondary | ICD-10-CM

## 2014-08-28 DIAGNOSIS — J069 Acute upper respiratory infection, unspecified: Secondary | ICD-10-CM

## 2014-08-28 DIAGNOSIS — E119 Type 2 diabetes mellitus without complications: Secondary | ICD-10-CM

## 2014-08-28 DIAGNOSIS — I10 Essential (primary) hypertension: Secondary | ICD-10-CM

## 2014-08-28 DIAGNOSIS — Z23 Encounter for immunization: Secondary | ICD-10-CM

## 2014-08-28 MED ORDER — LEVOTHYROXINE SODIUM 100 MCG PO TABS: 100.0000 ug | ORAL_TABLET | Freq: Every day | ORAL | Status: DC

## 2014-08-28 MED ORDER — METHYLPREDNISOLONE ACETATE 80 MG/ML IJ SUSP
80.0000 mg | Freq: Once | INTRAMUSCULAR | Status: AC
  Administered 2014-08-28: 80 mg via INTRAMUSCULAR

## 2014-08-28 MED ORDER — HYDROCODONE-HOMATROPINE 5-1.5 MG/5ML PO SYRP: 5.0000 mL | ORAL_SOLUTION | Freq: Four times a day (QID) | ORAL | Status: DC | PRN

## 2014-08-28 MED ORDER — HYDROCHLOROTHIAZIDE 25 MG PO TABS: 25.0000 mg | ORAL_TABLET | Freq: Every day | ORAL | Status: DC

## 2014-08-28 MED ORDER — BENAZEPRIL HCL 40 MG PO TABS: 40.0000 mg | ORAL_TABLET | Freq: Every day | ORAL | Status: DC

## 2014-08-28 MED ORDER — METFORMIN HCL 500 MG PO TABS: 500.0000 mg | ORAL_TABLET | Freq: Two times a day (BID) | ORAL | Status: DC

## 2014-08-28 MED ORDER — PIOGLITAZONE HCL 45 MG PO TABS: 45.0000 mg | ORAL_TABLET | Freq: Every day | ORAL | Status: DC

## 2014-08-28 MED ORDER — ATORVASTATIN CALCIUM 80 MG PO TABS: 80.0000 mg | ORAL_TABLET | Freq: Every day | ORAL | Status: DC

## 2014-08-28 MED ORDER — ESCITALOPRAM OXALATE 10 MG PO TABS
10.0000 mg | ORAL_TABLET | Freq: Every day | ORAL | Status: DC
Start: 2014-08-28 — End: 2015-01-29

## 2014-08-28 MED ORDER — PREDNISONE 10 MG PO TABS: ORAL_TABLET | ORAL | Status: DC

## 2014-08-28 MED ORDER — OMEPRAZOLE 20 MG PO CPDR: 20.0000 mg | DELAYED_RELEASE_CAPSULE | Freq: Every day | ORAL | Status: DC

## 2014-08-28 MED ORDER — ROSUVASTATIN CALCIUM 20 MG PO TABS: 20.0000 mg | ORAL_TABLET | Freq: Every day | ORAL | Status: DC

## 2014-08-28 MED ORDER — AMLODIPINE BESYLATE 10 MG PO TABS
10.0000 mg | ORAL_TABLET | Freq: Every day | ORAL | Status: DC
Start: 2014-08-28 — End: 2015-07-31

## 2014-08-28 MED ORDER — LEVOFLOXACIN 250 MG PO TABS: 250.0000 mg | ORAL_TABLET | Freq: Every day | ORAL | Status: DC

## 2014-08-28 NOTE — Assessment & Plan Note (Signed)
Mild to mod, for depomedrol IM, predpac asd, to f/u any worsening symptoms or concerns 

## 2014-08-28 NOTE — Assessment & Plan Note (Signed)
stable overall by history and exam, recent data reviewed with pt, and pt to continue medical treatment as before,  to f/u any worsening symptoms or concerns BP Readings from Last 3 Encounters:  08/28/14 132/64  02/27/14 132/70  10/23/13 122/70

## 2014-08-28 NOTE — Assessment & Plan Note (Signed)
Mild to mod, for antibx course,  to f/u any worsening symptoms or concerns 

## 2014-08-28 NOTE — Assessment & Plan Note (Signed)
stable overall by history and exam, recent data reviewed with pt, and pt to continue medical treatment as before,  to f/u any worsening symptoms or concerns Lab Results  Component Value Date   HGBA1C 7.1* 10/23/2013   Pt to monitor onset polys or cbg > 200 with steroid tx

## 2014-08-28 NOTE — Progress Notes (Signed)
Subjective:    Patient ID: Amanda Gentry, female    DOB: 07-05-1946, 68 y.o.   MRN: [id number]  HPI  Here with acute onset mild to mod 2-3 days ST, HA, general weakness and malaise, with prod cough greenish sputum, but Pt denies chest pain, increased sob or doe, wheezing, orthopnea, PND, increased LE swelling, palpitations, dizziness or syncope, except for mild onset wheezing since last PM with mild sob. Also Lipitor generic causes bad taste in mouth.  Past Medical History  Diagnosis Date  . ANXIETY 09/23/2007  . AORTIC INSUFFICIENCY 09/23/2007  . DEPRESSION 09/23/2007  . DIABETES MELLITUS, TYPE II 09/23/2007  . GERD 09/23/2007  . HYPERLIPIDEMIA 09/23/2007  . HYPERTENSION 09/23/2007  . HYPERTHYROIDISM 09/23/2007  . INSOMNIA 09/23/2007  . KNEE PAIN, RIGHT, CHRONIC 03/14/2008  . LOW BACK PAIN 09/23/2007  . Morbid obesity 09/23/2007  . OSTEOARTHRITIS 09/23/2007  . PERIPHERAL EDEMA 09/23/2007  . VITAMIN D DEFICIENCY 03/31/2010  . Degenerative arthritis of right knee 01/28/2012   Past Surgical History  Procedure Laterality Date  . Abdominal hysterectomy    . Cholecystectomy    . Oophorectomy      reports that she has never smoked. She does not have any smokeless tobacco history on file. She reports that she drinks alcohol. She reports that she does not use illicit drugs. family history includes Hypertension in her other. Allergies  Allergen Reactions  . Lipitor [Atorvastatin]     Generic capsule causes bad taste in mouth   Current Outpatient Prescriptions on File Prior to Visit  Medication Sig Dispense Refill  . ALPRAZolam (XANAX) 0.5 MG tablet take 1 tablet by mouth once daily if needed for anxiety  30 tablet  5  . aspirin 81 MG tablet Take 81 mg by mouth daily.        . diclofenac (VOLTAREN) 75 MG EC tablet take 1 tablet by mouth twice a day if needed  60 tablet  3  . glucose blood (ONE TOUCH TEST STRIPS) test strip Use as directed once daily  100 each  11  . Lancets MISC Use as directed  once daily       . omeprazole (PRILOSEC) 20 MG capsule Take 1 capsule (20 mg total) by mouth daily.  90 capsule  3  . traMADol (ULTRAM) 50 MG tablet take 1 tablet by mouth four times a day if needed  120 tablet  5   No current facility-administered medications on file prior to visit.    Review of Systems  Constitutional: Negative for unusual diaphoresis or other sweats  HENT: Negative for ringing in ear Eyes: Negative for double vision or worsening visual disturbance.  Respiratory: Negative for choking and stridor.   Gastrointestinal: Negative for vomiting or other signifcant bowel change Genitourinary: Negative for hematuria or decreased urine volume.  Musculoskeletal: Negative for other MSK pain or swelling Skin: Negative for color change and worsening wound.  Neurological: Negative for tremors and numbness other than noted  Psychiatric/Behavioral: Negative for decreased concentration or agitation other than above       Objective:   Physical Exam BP 132/64  Pulse 89  Temp(Src) 98.2 F (36.8 C) (Oral) VS noted, mild ill Constitutional: Pt appears well-developed, well-nourished.  HENT: Head: NCAT.  Right Ear: External ear normal.  Left Ear: External ear normal.  Eyes: . Pupils are equal, round, and reactive to light. Conjunctivae and EOM are normal Bilat tm's with mild erythema.  Max sinus areas non tender.  Pharynx with mild erythema,  no exudate Neck: Normal range of motion. Neck supple.  Cardiovascular: Normal rate and regular rhythm.   Pulmonary/Chest: Effort normal and breath sounds decresaed with few bilat wheezes.  Neurological: Pt is alert. Not confused , motor grossly intact Skin: Skin is warm. No rash Psychiatric: Pt behavior is normal. No agitation.     Assessment & Plan:

## 2014-08-28 NOTE — Patient Instructions (Addendum)
You had the flu shot today, and the steroid shot  Please return in 2 weeks for a Nurse Appt to get the new Prevnar pneumonia shot (we will have to ask the office manager if this can be done with you seated in the car since you are unable to get in the door without assist)  Please take all new medication as prescribed - antibiotic, cough medicine, and prednisone  OK to change the atorvastatin back to the Crestor  Please continue all other medications as before, and refills have been done if requested.  Please have the pharmacy call with any other refills you may need.  Please keep your appointments with your specialists as you may have planned

## 2014-08-28 NOTE — Progress Notes (Signed)
Pre visit review using our clinic review tool, if applicable. No additional management support is needed unless otherwise documented below in the visit note. 

## 2014-08-30 ENCOUNTER — Telehealth: Payer: Self-pay

## 2014-08-30 NOTE — Telephone Encounter (Signed)
No eye exam this year.  Pt stated that she will schedule her exam for next year.

## 2014-09-09 ENCOUNTER — Ambulatory Visit: Payer: Medicare (Managed Care)

## 2014-09-10 ENCOUNTER — Ambulatory Visit: Payer: Medicare (Managed Care) | Admitting: *Deleted

## 2014-09-10 DIAGNOSIS — Z23 Encounter for immunization: Secondary | ICD-10-CM

## 2014-09-10 MED ORDER — PNEUMOCOCCAL 13-VAL CONJ VACC IM SUSP: 0.5000 mL | INTRAMUSCULAR | Status: DC

## 2014-09-12 ENCOUNTER — Ambulatory Visit: Payer: Medicare (Managed Care)

## 2014-09-13 ENCOUNTER — Telehealth: Payer: Self-pay | Admitting: *Deleted

## 2014-09-13 NOTE — Telephone Encounter (Signed)
Patient Information: Caller Name: Arbie Cookey Phone: [phone] Patient: Amanda Gentry, Amanda Gentry Gender: Female DOB: 1946/06/25 Age: 68 Years PCP: Cathlean Cower (Adults only) Office Follow Up: Does the office need to follow up with this patient?: Yes Instructions For The Office: Leg swelling with weeping RN Note: Patient calling regarding increase swelling of right leg. Swelling involves entire leg which she admits has been a probem in the past due to injury which envolved broken femur & hip. Today she has noticed weeping of fluid on back of calf area where prior sore has been. Symptoms Reason For Call & Symptoms: swelling in right leg Reviewed Health History In EMR: Yes Reviewed Medications In EMR: Yes Reviewed Allergies In EMR: Yes Reviewed Surgeries / Procedures: Yes Date of Onset of Symptoms: 09/06/2014 Guideline(s) Used: Leg Swelling and Edema Disposition Per Guideline: Go to ED Now (or to Office with PCP Approval) Reason For Disposition Reached: Severe swelling (e.g., swelling extends above knee, entire leg is swollen, weeping fluid) Advice Given: Call Back If: You become worse. Patient Will Follow Care Advice: YES Triage Document To [facility] ([place]) [id number] Fax [phone] [place], [place] [address] p. [phone] f. [phone] Taken by Merrilee Seashore, RN Date 10/23/201509:56:59

## 2014-09-16 ENCOUNTER — Telehealth: Payer: Self-pay | Admitting: Internal Medicine

## 2014-09-16 NOTE — Telephone Encounter (Signed)
MD is out will hold until he return tomorrow with his response...Amanda Gentry

## 2014-09-16 NOTE — Telephone Encounter (Signed)
Ok for note  Done hardcopy to [employer]

## 2014-09-16 NOTE — Telephone Encounter (Signed)
Pt received letter for [facility] Nov 05, 2014. Pt feels she cannot do this as she needs assistance with all aspects of outings. Pt requests a letter that she does not have to serve on [employer] duty due to her health related issues.

## 2014-09-17 NOTE — Telephone Encounter (Signed)
Notified pt letter ready for pick-up..lmb 

## 2014-09-30 ENCOUNTER — Telehealth (HOSPITAL_COMMUNITY): Payer: Self-pay | Admitting: *Deleted

## 2014-09-30 NOTE — Telephone Encounter (Signed)
Spoke with pt. to schedule for AWV but she expressed no desire to have one. Refused breast ca screening, colorectal ca screening & other labs due.

## 2015-01-03 ENCOUNTER — Other Ambulatory Visit: Payer: Self-pay | Admitting: Internal Medicine

## 2015-01-29 ENCOUNTER — Other Ambulatory Visit (INDEPENDENT_AMBULATORY_CARE_PROVIDER_SITE_OTHER): Payer: Medicare (Managed Care)

## 2015-01-29 ENCOUNTER — Ambulatory Visit (INDEPENDENT_AMBULATORY_CARE_PROVIDER_SITE_OTHER): Payer: Medicare (Managed Care) | Admitting: Internal Medicine

## 2015-01-29 ENCOUNTER — Encounter: Payer: Self-pay | Admitting: Internal Medicine

## 2015-01-29 VITALS — BP 134/84 | HR 86 | Temp 98.1°F | Resp 18

## 2015-01-29 DIAGNOSIS — Z79899 Other long term (current) drug therapy: Secondary | ICD-10-CM | POA: Diagnosis not present

## 2015-01-29 DIAGNOSIS — E119 Type 2 diabetes mellitus without complications: Secondary | ICD-10-CM

## 2015-01-29 DIAGNOSIS — I890 Lymphedema, not elsewhere classified: Secondary | ICD-10-CM

## 2015-01-29 DIAGNOSIS — E785 Hyperlipidemia, unspecified: Secondary | ICD-10-CM | POA: Diagnosis not present

## 2015-01-29 DIAGNOSIS — Z0000 Encounter for general adult medical examination without abnormal findings: Secondary | ICD-10-CM

## 2015-01-29 DIAGNOSIS — S81801A Unspecified open wound, right lower leg, initial encounter: Secondary | ICD-10-CM | POA: Insufficient documentation

## 2015-01-29 DIAGNOSIS — S81801D Unspecified open wound, right lower leg, subsequent encounter: Secondary | ICD-10-CM

## 2015-01-29 DIAGNOSIS — I10 Essential (primary) hypertension: Secondary | ICD-10-CM | POA: Diagnosis not present

## 2015-01-29 DIAGNOSIS — F321 Major depressive disorder, single episode, moderate: Secondary | ICD-10-CM | POA: Insufficient documentation

## 2015-01-29 HISTORY — DX: Lymphedema, not elsewhere classified: I89.0

## 2015-01-29 LAB — CBC WITH DIFFERENTIAL/PLATELET
Basophils Absolute: 0 10*3/uL (ref 0.0–0.1)
Basophils Relative: 0.3 % (ref 0.0–3.0)
EOS ABS: 0 10*3/uL (ref 0.0–0.7)
Eosinophils Relative: 0.7 % (ref 0.0–5.0)
HCT: 37.2 % (ref 36.0–46.0)
Hemoglobin: 12.1 g/dL (ref 12.0–15.0)
LYMPHS ABS: 0.8 10*3/uL (ref 0.7–4.0)
Lymphocytes Relative: 11.1 % — ABNORMAL LOW (ref 12.0–46.0)
MCHC: 32.6 g/dL (ref 30.0–36.0)
MCV: 85.3 fl (ref 78.0–100.0)
Monocytes Absolute: 0.4 10*3/uL (ref 0.1–1.0)
Monocytes Relative: 5.5 % (ref 3.0–12.0)
NEUTROS PCT: 82.4 % — AB (ref 43.0–77.0)
Neutro Abs: 5.6 10*3/uL (ref 1.4–7.7)
PLATELETS: 169 10*3/uL (ref 150.0–400.0)
RBC: 4.37 Mil/uL (ref 3.87–5.11)
RDW: 17.2 % — AB (ref 11.5–15.5)
WBC: 6.8 10*3/uL (ref 4.0–10.5)

## 2015-01-29 LAB — HEMOGLOBIN A1C: Hgb A1c MFr Bld: 7.1 % — ABNORMAL HIGH (ref 4.6–6.5)

## 2015-01-29 MED ORDER — ESCITALOPRAM OXALATE 10 MG PO TABS: 10.0000 mg | ORAL_TABLET | Freq: Every day | ORAL | Status: DC

## 2015-01-29 NOTE — Patient Instructions (Signed)
Please take all new medication as prescribed - the lexapro 10 mg per day  Please continue all other medications as before, and refills have been done if requested.  Please have the pharmacy call with any other refills you may need.  Please continue your efforts at being more active, low cholesterol diet, and weight control.  You are otherwise up to date with prevention measures today.  Please keep your appointments with your specialists as you may have planned  You will be contacted regarding the referral for: Home health (with RN and PT)  Please go to the LAB in the Basement (turn left off the elevator) for the tests to be done today  You will be contacted by phone if any changes need to be made immediately.  Otherwise, you will receive a letter about your results with an explanation, but please check with MyChart first.  Please return in 1 months, or sooner if needed

## 2015-01-29 NOTE — Progress Notes (Signed)
Pre visit review using our clinic review tool, if applicable. No additional management support is needed unless otherwise documented below in the visit note. 

## 2015-01-29 NOTE — Assessment & Plan Note (Signed)
Likely would benefit from wraps,  to f/u any worsening symptoms or concerns

## 2015-01-29 NOTE — Assessment & Plan Note (Signed)

## 2015-01-29 NOTE — Assessment & Plan Note (Signed)
Small superficial, for cont'd neosporin bandaid, will ask HH for wound care as well

## 2015-01-29 NOTE — Progress Notes (Addendum)
Subjective:    Patient ID: Amanda Gentry, female    DOB: 1945-12-03, 69 y.o.   MRN: [id number]  HPI  Here for wellness and f/u;  Overall doing ok;  Pt denies Chest pain, worsening SOB, DOE, wheezing, orthopnea, PND, worsening LE edema, palpitations, dizziness or syncope.  Pt denies neurological change such as new headache, facial or extremity weakness.  Pt denies polydipsia, polyuria, or low sugar symptoms. Pt states overall good compliance with treatment and medications, good tolerability, and has been trying to follow appropriate diet. No fever, night sweats, wt loss, loss of appetite, or other constitutional symptoms.  Pt states good ability with ADL's, has low fall risk, home safety reviewed and adequate, no other significant changes in hearing or vision, and only occasionally active with exercise.  Pt here with family, all agree pt has severe depression x 3 mo, essentially has been sitting on the same chair for 3 mo (and sleeping there) only getting up with greater and more difficulty to BR.  Has now worse and severe right > left lymphedema with quite a bit of weepiness bilat.. Also with a persistent superficial ulcer to right post calf, does not appear infected, but chronic by hx of over 1 yr.  Pt needs semi-electric hosp bed, needs HOB elevated at 30 degrees due to medical condition, and needs frequent re-positioning in ways that cannot be achieved in a normal bed or with a wedge pillow to alleviate pressure, relieve pain, or treat pressure ulcers. Past Medical History  Diagnosis Date  . ANXIETY 09/23/2007  . AORTIC INSUFFICIENCY 09/23/2007  . DEPRESSION 09/23/2007  . DIABETES MELLITUS, TYPE II 09/23/2007  . GERD 09/23/2007  . HYPERLIPIDEMIA 09/23/2007  . HYPERTENSION 09/23/2007  . HYPERTHYROIDISM 09/23/2007  . INSOMNIA 09/23/2007  . KNEE PAIN, RIGHT, CHRONIC 03/14/2008  . LOW BACK PAIN 09/23/2007  . Morbid obesity 09/23/2007  . OSTEOARTHRITIS 09/23/2007  . PERIPHERAL EDEMA 09/23/2007  . VITAMIN D  DEFICIENCY 03/31/2010  . Degenerative arthritis of right knee 01/28/2012  . Acquired lymphedema of leg 01/29/2015   Past Surgical History  Procedure Laterality Date  . Abdominal hysterectomy    . Cholecystectomy    . Oophorectomy      reports that she has never smoked. She does not have any smokeless tobacco history on file. She reports that she drinks alcohol. She reports that she does not use illicit drugs. family history includes Hypertension in her other. Allergies  Allergen Reactions  . Lipitor [Atorvastatin]     Generic capsule causes bad taste in mouth   Current Outpatient Prescriptions on File Prior to Visit  Medication Sig Dispense Refill  . ALPRAZolam (XANAX) 0.5 MG tablet take 1 tablet by mouth once daily if needed for anxiety 30 tablet 5  . amLODipine (NORVASC) 10 MG tablet Take 1 tablet (10 mg total) by mouth daily. 90 tablet 3  . aspirin 81 MG tablet Take 81 mg by mouth daily.      . benazepril (LOTENSIN) 40 MG tablet Take 1 tablet (40 mg total) by mouth daily. 90 tablet 3  . diclofenac (VOLTAREN) 75 MG EC tablet take 1 tablet by mouth twice a day if needed 60 tablet 3  . glucose blood (ONE TOUCH TEST STRIPS) test strip Use as directed once daily 100 each 11  . hydrochlorothiazide (HYDRODIURIL) 25 MG tablet Take 1 tablet (25 mg total) by mouth daily. 90 tablet 3  . HYDROcodone-homatropine (HYCODAN) 5-1.5 MG/5ML syrup Take 5 mLs by mouth every 6 (six)  hours as needed for cough. 180 mL 0  . Lancets MISC Use as directed once daily     . levofloxacin (LEVAQUIN) 250 MG tablet Take 1 tablet (250 mg total) by mouth daily. 10 tablet 0  . levothyroxine (SYNTHROID, LEVOTHROID) 100 MCG tablet Take 1 tablet (100 mcg total) by mouth daily. 90 tablet 3  . metFORMIN (GLUCOPHAGE) 500 MG tablet Take 1 tablet (500 mg total) by mouth 2 (two) times daily with a meal. 180 tablet 3  . omeprazole (PRILOSEC) 20 MG capsule Take 1 capsule (20 mg total) by mouth daily. 90 capsule 3  . omeprazole  (PRILOSEC) 20 MG capsule Take 1 capsule (20 mg total) by mouth daily. 90 capsule 3  . pioglitazone (ACTOS) 45 MG tablet Take 1 tablet (45 mg total) by mouth daily. 90 tablet 3  . predniSONE (DELTASONE) 10 MG tablet 3 tabs by mouth per day for 3 days,2tabs per day for 3 days,1tab per day for 3 days 18 tablet 0  . rosuvastatin (CRESTOR) 20 MG tablet Take 1 tablet (20 mg total) by mouth daily. 90 tablet 3  . traMADol (ULTRAM) 50 MG tablet take 1 tablet by mouth four times a day if needed 120 tablet 5   Current Facility-Administered Medications on File Prior to Visit  Medication Dose Route Frequency Provider Last Rate Last Dose  . pneumococcal 13-valent conjugate vaccine (PREVNAR 13) injection 0.5 mL  0.5 mL Intramuscular Tomorrow-1000 Biagio Borg, MD        Review of Systems Constitutional: Negative for increased diaphoresis, other activity, appetite or siginficant weight change other than noted HENT: Negative for worsening hearing loss, ear pain, facial swelling, mouth sores and neck stiffness.   Eyes: Negative for other worsening pain, redness or visual disturbance.  Respiratory: Negative for shortness of breath and wheezing  Cardiovascular: Negative for chest pain and palpitations.  Gastrointestinal: Negative for diarrhea, blood in stool, abdominal distention or other pain Genitourinary: Negative for hematuria, flank pain or change in urine volume.  Musculoskeletal: Negative for myalgias or other joint complaints.  Skin: Negative for color change or drainage.  Neurological: Negative for syncope and numbness. other than noted Hematological: Negative for adenopathy. or other swelling Psychiatric/Behavioral: Negative for hallucinations, SI, self-injury, decreased concentration or other worsening agitation.      Objective:   Physical Exam BP 134/84 mmHg  Pulse 86  Temp(Src) 98.1 F (36.7 C) (Oral)  Resp 18  SpO2 92% VS noted,  Constitutional: Pt is oriented to person, place, and time.  Appears well-developed and well-nourished, in no significant distress Head: Normocephalic and atraumatic.  Right Ear: External ear normal.  Left Ear: External ear normal.  Nose: Nose normal.  Mouth/Throat: Oropharynx is clear and moist.  Eyes: Conjunctivae and EOM are normal. Pupils are equal, round, and reactive to light.  Neck: Normal range of motion. Neck supple. No JVD present. No tracheal deviation present or significant neck LA or mass Cardiovascular: Normal rate, regular rhythm, normal heart sounds and intact distal pulses.   Pulmonary/Chest: Effort normal and breath sounds without rales or wheezing  Abdominal: Soft. Bowel sounds are normal. NT. No HSM  Musculoskeletal: Normal range of motion. Exhibits no edema.  Lymphadenopathy:  Has no cervical adenopathy.  Neurological: Pt is alert and oriented to person, place, and time. Pt has normal reflexes. No cranial nerve deficit. Motor grossly intact Skin: Skin is warm and dry. No rash noted. , has massive right thigh and leg, as well Left leg lymphedematous type changes Psychiatric:  Has tearful, depressed mood and affect. Behavior is normal.     Assessment & Plan:

## 2015-01-29 NOTE — Assessment & Plan Note (Signed)
For lexapro 10 qd, denies SI, declines counseling or psychaitry referral

## 2015-01-30 ENCOUNTER — Encounter: Payer: Self-pay | Admitting: Internal Medicine

## 2015-01-30 LAB — BASIC METABOLIC PANEL
BUN: 22 mg/dL (ref 6–23)
CO2: 27 mEq/L (ref 19–32)
Calcium: 9.7 mg/dL (ref 8.4–10.5)
Chloride: 105 mEq/L (ref 96–112)
Creatinine, Ser: 0.74 mg/dL (ref 0.40–1.20)
GFR: 82.8 mL/min (ref >60.00)
Glucose, Bld: 120 mg/dL — ABNORMAL HIGH (ref 70–99)
Potassium: 4.3 mEq/L (ref 3.5–5.1)
Sodium: 139 mEq/L (ref 135–145)

## 2015-01-30 LAB — LIPID PANEL
CHOL/HDL RATIO: 3
CHOLESTEROL: 103 mg/dL (ref 0–200)
HDL: 37.8 mg/dL — ABNORMAL LOW (ref >39.00)
LDL CALC: 48 mg/dL (ref 0–99)
NONHDL: 65.2
Triglycerides: 87 mg/dL (ref 0.0–149.0)
VLDL: 17.4 mg/dL (ref 0.0–40.0)

## 2015-01-30 LAB — TSH: TSH: 1.7 u[IU]/mL (ref 0.35–4.50)

## 2015-01-30 LAB — HEPATIC FUNCTION PANEL
ALK PHOS: 67 U/L (ref 39–117)
ALT: 9 U/L (ref 0–35)
AST: 17 U/L (ref 0–37)
Albumin: 3.8 g/dL (ref 3.5–5.2)
BILIRUBIN TOTAL: 0.5 mg/dL (ref 0.2–1.2)
Bilirubin, Direct: 0.1 mg/dL (ref 0.0–0.3)
TOTAL PROTEIN: 7.6 g/dL (ref 6.0–8.3)

## 2015-02-12 ENCOUNTER — Telehealth: Payer: Self-pay | Admitting: Internal Medicine

## 2015-02-12 DIAGNOSIS — M1712 Unilateral primary osteoarthritis, left knee: Principal | ICD-10-CM

## 2015-02-12 DIAGNOSIS — I890 Lymphedema, not elsewhere classified: Secondary | ICD-10-CM

## 2015-02-12 DIAGNOSIS — R269 Unspecified abnormalities of gait and mobility: Secondary | ICD-10-CM

## 2015-02-12 DIAGNOSIS — M1711 Unilateral primary osteoarthritis, right knee: Secondary | ICD-10-CM

## 2015-02-12 DIAGNOSIS — M170 Bilateral primary osteoarthritis of knee: Secondary | ICD-10-CM

## 2015-02-12 NOTE — Telephone Encounter (Signed)
He is calling for verbal order for PT- frequency change  Recommending hospital bed and lift chair ordered

## 2015-02-12 NOTE — Telephone Encounter (Signed)
Ok for verbal 

## 2015-02-13 NOTE — Telephone Encounter (Signed)
Therapist return call back he stated to fax scripts for hospital bed & lift chair to [facility]. Generated DME fax to [phone]...Johny Chess

## 2015-02-13 NOTE — Telephone Encounter (Signed)
Called therapist no answer LMOM ok for PT, but need call back to confirm where equipment need to be sent. Need fax #...Amanda Gentry

## 2015-02-18 ENCOUNTER — Other Ambulatory Visit: Payer: Self-pay

## 2015-02-18 ENCOUNTER — Other Ambulatory Visit: Payer: Self-pay | Admitting: Internal Medicine

## 2015-02-18 DIAGNOSIS — M1711 Unilateral primary osteoarthritis, right knee: Secondary | ICD-10-CM

## 2015-02-18 DIAGNOSIS — M1712 Unilateral primary osteoarthritis, left knee: Principal | ICD-10-CM

## 2015-02-18 DIAGNOSIS — M170 Bilateral primary osteoarthritis of knee: Secondary | ICD-10-CM

## 2015-02-18 NOTE — Telephone Encounter (Signed)
Rx done. 

## 2015-02-18 NOTE — Telephone Encounter (Signed)
Done hardcopy to Cherina  

## 2015-02-19 DIAGNOSIS — E119 Type 2 diabetes mellitus without complications: Secondary | ICD-10-CM | POA: Diagnosis not present

## 2015-02-25 ENCOUNTER — Ambulatory Visit: Payer: Medicare (Managed Care) | Admitting: Internal Medicine

## 2015-02-27 ENCOUNTER — Telehealth: Payer: Self-pay | Admitting: *Deleted

## 2015-02-27 NOTE — Telephone Encounter (Signed)
Ok for verbal 

## 2015-02-27 NOTE — Telephone Encounter (Signed)
Left msg on triage stating the unna boot is helping with ulcer/swelling. Wanting to get extended order for 2 more weeks. Is this ok...Johny Chess

## 2015-02-28 NOTE — Telephone Encounter (Signed)
Called [facility] spoke with clinical manager rhonda gave md response...Amanda Gentry

## 2015-03-05 ENCOUNTER — Telehealth: Payer: Self-pay | Admitting: Internal Medicine

## 2015-03-05 NOTE — Telephone Encounter (Signed)
[facility] -[phone] [facility]  Need orders for lift chair

## 2015-03-05 NOTE — Telephone Encounter (Signed)
Done hardcopy to Cherina  

## 2015-03-05 NOTE — Telephone Encounter (Signed)
Fax number to [facility] for lift chair is  [phone]

## 2015-03-06 NOTE — Telephone Encounter (Signed)
Orders have been faxed to [facility]  

## 2015-03-07 ENCOUNTER — Telehealth: Payer: Self-pay | Admitting: Internal Medicine

## 2015-03-07 NOTE — Telephone Encounter (Signed)
Please resubmit script for electric bed. Was received by [facility] with no NPI attached. Please attach NPI to the new script for medicare  Fax # [phone]

## 2015-03-07 NOTE — Telephone Encounter (Signed)
Done hardcopy to Cherina  

## 2015-03-07 NOTE — Telephone Encounter (Signed)
[facility]ed script to [facility].../lmb 

## 2015-03-21 ENCOUNTER — Telehealth: Payer: Self-pay | Admitting: Internal Medicine

## 2015-03-21 NOTE — Telephone Encounter (Signed)
Patient has not been able to have physical therapy this week due to stomach bug.

## 2015-05-09 ENCOUNTER — Telehealth: Payer: Self-pay

## 2015-05-09 NOTE — Telephone Encounter (Signed)
Pt aware that she is due for a mammogram.

## 2015-05-21 ENCOUNTER — Telehealth: Payer: Self-pay | Admitting: Internal Medicine

## 2015-05-21 DIAGNOSIS — M79604 Pain in right leg: Secondary | ICD-10-CM

## 2015-05-21 DIAGNOSIS — M79605 Pain in left leg: Principal | ICD-10-CM

## 2015-05-21 NOTE — Telephone Encounter (Signed)
Patient daughter is calling to advise that the patient has been homebound since her last visit here in March. She has poor circulation in her legs. A nurse had been sent out there to wet them, and it seemed to help the swelling, but the patient's legs seem to be turning blue now. They do not know when this began, because she only stands when she goes to the restroom. They did, however, first notice yesterday. She is requesting a referral to a specialist.

## 2015-05-21 NOTE — Telephone Encounter (Signed)
The blueness is most likely related to vein swelling, and is common.  If there is no worsening leg pain, she would not necessarily need a vascular surgury referral, thanks

## 2015-05-22 NOTE — Telephone Encounter (Signed)
Disregard that phone number below: it should [phone]

## 2015-05-22 NOTE — Telephone Encounter (Signed)
Please call patient's daughter at [phone]

## 2015-05-22 NOTE — Telephone Encounter (Signed)
Pt advised in detail, daughter advised of same via personal VM

## 2015-05-23 NOTE — Telephone Encounter (Signed)
Pt's daughter advised but is adamantly requesting a referral to a specialist regarding pt's swollen and painful leg, please advise

## 2015-05-23 NOTE — Addendum Note (Signed)
Addended by: Biagio Borg on: 05/23/2015 03:34 PM   Modules accepted: Orders

## 2015-05-23 NOTE — Telephone Encounter (Signed)
Referral to vascular surgury done

## 2015-05-23 NOTE — Telephone Encounter (Signed)
Left message on malorie's cell (daughter to patient) advising of dr Judi Cong note, referral sent and that office will contact her for appt

## 2015-06-17 ENCOUNTER — Other Ambulatory Visit: Payer: Self-pay | Admitting: *Deleted

## 2015-06-17 DIAGNOSIS — R609 Edema, unspecified: Secondary | ICD-10-CM

## 2015-06-17 DIAGNOSIS — M79606 Pain in leg, unspecified: Secondary | ICD-10-CM

## 2015-07-02 ENCOUNTER — Encounter: Payer: Medicare (Managed Care) | Admitting: Vascular Surgery

## 2015-07-02 ENCOUNTER — Encounter (HOSPITAL_COMMUNITY): Payer: Medicare (Managed Care)

## 2015-07-13 ENCOUNTER — Other Ambulatory Visit: Payer: Self-pay | Admitting: Internal Medicine

## 2015-07-15 NOTE — Telephone Encounter (Signed)
Done hardcopy to Dahlia  

## 2015-07-16 NOTE — Telephone Encounter (Signed)
Rx faxed to pharmacy  

## 2015-07-22 ENCOUNTER — Encounter: Payer: Self-pay | Admitting: Vascular Surgery

## 2015-07-23 ENCOUNTER — Emergency Department (HOSPITAL_COMMUNITY): Payer: Medicare Other

## 2015-07-23 ENCOUNTER — Telehealth: Payer: Self-pay | Admitting: *Deleted

## 2015-07-23 ENCOUNTER — Encounter: Payer: Self-pay | Admitting: Vascular Surgery

## 2015-07-23 ENCOUNTER — Encounter (HOSPITAL_COMMUNITY): Payer: Self-pay

## 2015-07-23 ENCOUNTER — Inpatient Hospital Stay (HOSPITAL_COMMUNITY)
Admission: EM | Admit: 2015-07-23 | Discharge: 2015-07-31 | DRG: 291 | Disposition: A | Discharge status: Hospice | Payer: Medicare Other | Attending: Internal Medicine | Admitting: Internal Medicine

## 2015-07-23 ENCOUNTER — Ambulatory Visit (HOSPITAL_COMMUNITY)
Admission: RE | Admit: 2015-07-23 | Discharge: 2015-07-23 | Disposition: A | Discharge status: Home / Self Care | Payer: Medicare Other | Source: Ambulatory Visit | Attending: Vascular Surgery | Admitting: Vascular Surgery

## 2015-07-23 ENCOUNTER — Other Ambulatory Visit: Payer: Self-pay

## 2015-07-23 ENCOUNTER — Ambulatory Visit (INDEPENDENT_AMBULATORY_CARE_PROVIDER_SITE_OTHER): Payer: Medicare (Managed Care) | Admitting: Vascular Surgery

## 2015-07-23 VITALS — BP 121/55 | HR 75 | Temp 97.4°F | Ht 62.0 in | Wt 310.0 lb

## 2015-07-23 DIAGNOSIS — L97119 Non-pressure chronic ulcer of right thigh with unspecified severity: Secondary | ICD-10-CM | POA: Diagnosis present

## 2015-07-23 DIAGNOSIS — I890 Lymphedema, not elsewhere classified: Secondary | ICD-10-CM | POA: Diagnosis present

## 2015-07-23 DIAGNOSIS — Z515 Encounter for palliative care: Secondary | ICD-10-CM

## 2015-07-23 DIAGNOSIS — E662 Morbid (severe) obesity with alveolar hypoventilation: Secondary | ICD-10-CM | POA: Diagnosis present

## 2015-07-23 DIAGNOSIS — J9622 Acute and chronic respiratory failure with hypercapnia: Secondary | ICD-10-CM | POA: Diagnosis not present

## 2015-07-23 DIAGNOSIS — Z9119 Patient's noncompliance with other medical treatment and regimen: Secondary | ICD-10-CM | POA: Diagnosis present

## 2015-07-23 DIAGNOSIS — T508X5A Adverse effect of diagnostic agents, initial encounter: Secondary | ICD-10-CM | POA: Diagnosis not present

## 2015-07-23 DIAGNOSIS — M79606 Pain in leg, unspecified: Secondary | ICD-10-CM

## 2015-07-23 DIAGNOSIS — E1140 Type 2 diabetes mellitus with diabetic neuropathy, unspecified: Secondary | ICD-10-CM | POA: Diagnosis present

## 2015-07-23 DIAGNOSIS — N170 Acute kidney failure with tubular necrosis: Secondary | ICD-10-CM | POA: Diagnosis not present

## 2015-07-23 DIAGNOSIS — R609 Edema, unspecified: Secondary | ICD-10-CM | POA: Diagnosis not present

## 2015-07-23 DIAGNOSIS — M25561 Pain in right knee: Secondary | ICD-10-CM | POA: Diagnosis not present

## 2015-07-23 DIAGNOSIS — L8990 Pressure ulcer of unspecified site, unspecified stage: Secondary | ICD-10-CM

## 2015-07-23 DIAGNOSIS — R748 Abnormal levels of other serum enzymes: Secondary | ICD-10-CM | POA: Diagnosis not present

## 2015-07-23 DIAGNOSIS — E1151 Type 2 diabetes mellitus with diabetic peripheral angiopathy without gangrene: Secondary | ICD-10-CM | POA: Diagnosis present

## 2015-07-23 DIAGNOSIS — IMO0002 Reserved for concepts with insufficient information to code with codable children: Secondary | ICD-10-CM

## 2015-07-23 DIAGNOSIS — K219 Gastro-esophageal reflux disease without esophagitis: Secondary | ICD-10-CM | POA: Diagnosis present

## 2015-07-23 DIAGNOSIS — R319 Hematuria, unspecified: Secondary | ICD-10-CM | POA: Diagnosis not present

## 2015-07-23 DIAGNOSIS — R0902 Hypoxemia: Secondary | ICD-10-CM

## 2015-07-23 DIAGNOSIS — L03115 Cellulitis of right lower limb: Secondary | ICD-10-CM | POA: Diagnosis present

## 2015-07-23 DIAGNOSIS — I872 Venous insufficiency (chronic) (peripheral): Secondary | ICD-10-CM

## 2015-07-23 DIAGNOSIS — I959 Hypotension, unspecified: Secondary | ICD-10-CM | POA: Diagnosis not present

## 2015-07-23 DIAGNOSIS — I4891 Unspecified atrial fibrillation: Secondary | ICD-10-CM | POA: Diagnosis present

## 2015-07-23 DIAGNOSIS — Z7401 Bed confinement status: Secondary | ICD-10-CM

## 2015-07-23 DIAGNOSIS — F419 Anxiety disorder, unspecified: Secondary | ICD-10-CM | POA: Diagnosis present

## 2015-07-23 DIAGNOSIS — I83893 Varicose veins of bilateral lower extremities with other complications: Secondary | ICD-10-CM | POA: Insufficient documentation

## 2015-07-23 DIAGNOSIS — D649 Anemia, unspecified: Secondary | ICD-10-CM | POA: Diagnosis present

## 2015-07-23 DIAGNOSIS — C499 Malignant neoplasm of connective and soft tissue, unspecified: Secondary | ICD-10-CM

## 2015-07-23 DIAGNOSIS — Z66 Do not resuscitate: Secondary | ICD-10-CM | POA: Diagnosis present

## 2015-07-23 DIAGNOSIS — I83813 Varicose veins of bilateral lower extremities with pain: Secondary | ICD-10-CM | POA: Insufficient documentation

## 2015-07-23 DIAGNOSIS — E875 Hyperkalemia: Secondary | ICD-10-CM | POA: Diagnosis not present

## 2015-07-23 DIAGNOSIS — N141 Nephropathy induced by other drugs, medicaments and biological substances: Secondary | ICD-10-CM | POA: Diagnosis not present

## 2015-07-23 DIAGNOSIS — J811 Chronic pulmonary edema: Secondary | ICD-10-CM

## 2015-07-23 DIAGNOSIS — Z6841 Body Mass Index (BMI) 40.0 and over, adult: Secondary | ICD-10-CM | POA: Diagnosis not present

## 2015-07-23 DIAGNOSIS — Z8249 Family history of ischemic heart disease and other diseases of the circulatory system: Secondary | ICD-10-CM

## 2015-07-23 DIAGNOSIS — I272 Other secondary pulmonary hypertension: Secondary | ICD-10-CM | POA: Diagnosis present

## 2015-07-23 DIAGNOSIS — I5033 Acute on chronic diastolic (congestive) heart failure: Principal | ICD-10-CM | POA: Diagnosis present

## 2015-07-23 DIAGNOSIS — I509 Heart failure, unspecified: Secondary | ICD-10-CM | POA: Diagnosis not present

## 2015-07-23 DIAGNOSIS — J9621 Acute and chronic respiratory failure with hypoxia: Secondary | ICD-10-CM | POA: Diagnosis not present

## 2015-07-23 DIAGNOSIS — E118 Type 2 diabetes mellitus with unspecified complications: Secondary | ICD-10-CM

## 2015-07-23 DIAGNOSIS — E872 Acidosis: Secondary | ICD-10-CM | POA: Diagnosis not present

## 2015-07-23 DIAGNOSIS — R34 Anuria and oliguria: Secondary | ICD-10-CM | POA: Diagnosis not present

## 2015-07-23 DIAGNOSIS — E039 Hypothyroidism, unspecified: Secondary | ICD-10-CM | POA: Diagnosis present

## 2015-07-23 DIAGNOSIS — Z5320 Procedure and treatment not carried out because of patient's decision for unspecified reasons: Secondary | ICD-10-CM | POA: Diagnosis present

## 2015-07-23 DIAGNOSIS — E119 Type 2 diabetes mellitus without complications: Secondary | ICD-10-CM

## 2015-07-23 DIAGNOSIS — D696 Thrombocytopenia, unspecified: Secondary | ICD-10-CM | POA: Diagnosis not present

## 2015-07-23 DIAGNOSIS — R1084 Generalized abdominal pain: Secondary | ICD-10-CM | POA: Diagnosis not present

## 2015-07-23 DIAGNOSIS — L03317 Cellulitis of buttock: Secondary | ICD-10-CM | POA: Diagnosis present

## 2015-07-23 DIAGNOSIS — I10 Essential (primary) hypertension: Secondary | ICD-10-CM | POA: Diagnosis present

## 2015-07-23 DIAGNOSIS — L89311 Pressure ulcer of right buttock, stage 1: Secondary | ICD-10-CM

## 2015-07-23 DIAGNOSIS — M25569 Pain in unspecified knee: Secondary | ICD-10-CM | POA: Diagnosis present

## 2015-07-23 DIAGNOSIS — Z809 Family history of malignant neoplasm, unspecified: Secondary | ICD-10-CM | POA: Diagnosis not present

## 2015-07-23 DIAGNOSIS — Z7409 Other reduced mobility: Secondary | ICD-10-CM

## 2015-07-23 DIAGNOSIS — Z7982 Long term (current) use of aspirin: Secondary | ICD-10-CM | POA: Diagnosis not present

## 2015-07-23 DIAGNOSIS — R7989 Other specified abnormal findings of blood chemistry: Secondary | ICD-10-CM

## 2015-07-23 DIAGNOSIS — R0602 Shortness of breath: Secondary | ICD-10-CM | POA: Diagnosis present

## 2015-07-23 DIAGNOSIS — I878 Other specified disorders of veins: Secondary | ICD-10-CM | POA: Diagnosis present

## 2015-07-23 DIAGNOSIS — E785 Hyperlipidemia, unspecified: Secondary | ICD-10-CM | POA: Diagnosis present

## 2015-07-23 DIAGNOSIS — Z7189 Other specified counseling: Secondary | ICD-10-CM | POA: Diagnosis not present

## 2015-07-23 DIAGNOSIS — F329 Major depressive disorder, single episode, unspecified: Secondary | ICD-10-CM | POA: Diagnosis present

## 2015-07-23 DIAGNOSIS — F4322 Adjustment disorder with anxiety: Secondary | ICD-10-CM | POA: Diagnosis present

## 2015-07-23 DIAGNOSIS — L89309 Pressure ulcer of unspecified buttock, unspecified stage: Secondary | ICD-10-CM | POA: Insufficient documentation

## 2015-07-23 DIAGNOSIS — J9602 Acute respiratory failure with hypercapnia: Secondary | ICD-10-CM | POA: Diagnosis present

## 2015-07-23 LAB — CBG MONITORING, ED
Glucose-Capillary: 101 mg/dL — ABNORMAL HIGH (ref 65–99)
Glucose-Capillary: 125 mg/dL — ABNORMAL HIGH (ref 65–99)

## 2015-07-23 LAB — URINALYSIS, ROUTINE W REFLEX MICROSCOPIC
Bilirubin Urine: NEGATIVE
GLUCOSE, UA: NEGATIVE mg/dL
Hgb urine dipstick: NEGATIVE
Ketones, ur: NEGATIVE mg/dL
LEUKOCYTES UA: NEGATIVE
Nitrite: NEGATIVE
PH: 5 (ref 5.0–8.0)
Protein, ur: NEGATIVE mg/dL
SPECIFIC GRAVITY, URINE: 1.023 (ref 1.005–1.030)
Urobilinogen, UA: 0.2 mg/dL (ref 0.0–1.0)

## 2015-07-23 LAB — BASIC METABOLIC PANEL
Anion gap: 5 (ref 5–15)
BUN: 33 mg/dL — AB (ref 6–20)
CHLORIDE: 113 mmol/L — AB (ref 101–111)
CO2: 23 mmol/L (ref 22–32)
CREATININE: 1.02 mg/dL — AB (ref 0.44–1.00)
Calcium: 8.8 mg/dL — ABNORMAL LOW (ref 8.9–10.3)
GFR calc Af Amer: >60 mL/min (ref >60)
GFR calc non Af Amer: 55 mL/min — ABNORMAL LOW (ref >60)
GLUCOSE: 109 mg/dL — AB (ref 65–99)
Potassium: 5 mmol/L (ref 3.5–5.1)
SODIUM: 141 mmol/L (ref 135–145)

## 2015-07-23 LAB — CBC WITH DIFFERENTIAL/PLATELET
Basophils Absolute: 0 10*3/uL (ref 0.0–0.1)
Basophils Relative: 0 % (ref 0–1)
EOS ABS: 0.1 10*3/uL (ref 0.0–0.7)
EOS PCT: 1 % (ref 0–5)
HCT: 37.7 % (ref 36.0–46.0)
HEMOGLOBIN: 11.5 g/dL — AB (ref 12.0–15.0)
LYMPHS ABS: 0.5 10*3/uL — AB (ref 0.7–4.0)
Lymphocytes Relative: 7 % — ABNORMAL LOW (ref 12–46)
MCH: 27.4 pg (ref 26.0–34.0)
MCHC: 30.5 g/dL (ref 30.0–36.0)
MCV: 90 fL (ref 78.0–100.0)
MONOS PCT: 7 % (ref 3–12)
Monocytes Absolute: 0.5 10*3/uL (ref 0.1–1.0)
NEUTROS PCT: 85 % — AB (ref 43–77)
Neutro Abs: 6.2 10*3/uL (ref 1.7–7.7)
Platelets: 163 10*3/uL (ref 150–400)
RBC: 4.19 MIL/uL (ref 3.87–5.11)
RDW: 17.7 % — ABNORMAL HIGH (ref 11.5–15.5)
WBC: 7.2 10*3/uL (ref 4.0–10.5)

## 2015-07-23 LAB — BLOOD GAS, ARTERIAL
ACID-BASE DEFICIT: 2.9 mmol/L — AB (ref 0.0–2.0)
Bicarbonate: 22 mEq/L (ref 20.0–24.0)
DRAWN BY: 422461
O2 Content: 2 L/min
O2 SAT: 93.7 %
PATIENT TEMPERATURE: 98.7
TCO2: 20.4 mmol/L (ref 0–100)
pCO2 arterial: 41.2 mmHg (ref 35.0–45.0)
pH, Arterial: 7.347 — ABNORMAL LOW (ref 7.350–7.450)
pO2, Arterial: 73 mmHg — ABNORMAL LOW (ref 80.0–100.0)

## 2015-07-23 LAB — BRAIN NATRIURETIC PEPTIDE: B NATRIURETIC PEPTIDE 5: 222.4 pg/mL — AB (ref 0.0–100.0)

## 2015-07-23 MED ORDER — SODIUM CHLORIDE 0.9 % IJ SOLN
3.0000 mL | Freq: Two times a day (BID) | INTRAMUSCULAR | Status: DC
  Administered 2015-07-24 – 2015-07-30 (×9): 3 mL via INTRAVENOUS

## 2015-07-23 MED ORDER — SODIUM CHLORIDE 0.9 % IJ SOLN: 3.0000 mL | INTRAMUSCULAR | Status: DC | PRN

## 2015-07-23 MED ORDER — INSULIN ASPART 100 UNIT/ML ~~LOC~~ SOLN
0.0000 [IU] | Freq: Three times a day (TID) | SUBCUTANEOUS | Status: DC
  Administered 2015-07-25 – 2015-07-26 (×3): 2 [IU] via SUBCUTANEOUS

## 2015-07-23 MED ORDER — LEVOTHYROXINE SODIUM 50 MCG PO TABS
100.0000 ug | ORAL_TABLET | Freq: Every day | ORAL | Status: DC
  Administered 2015-07-24 – 2015-07-28 (×5): 100 ug via ORAL
  Filled 2015-07-23: qty 2
  Filled 2015-07-23 (×2): qty 1
  Filled 2015-07-23 (×2): qty 2

## 2015-07-23 MED ORDER — ESCITALOPRAM OXALATE 20 MG PO TABS
10.0000 mg | ORAL_TABLET | Freq: Every day | ORAL | Status: DC
Start: 2015-07-24 — End: 2015-07-25
  Administered 2015-07-24 – 2015-07-25 (×2): 10 mg via ORAL
  Filled 2015-07-23 (×2): qty 1

## 2015-07-23 MED ORDER — INSULIN ASPART 100 UNIT/ML ~~LOC~~ SOLN
0.0000 [IU] | Freq: Every day | SUBCUTANEOUS | Status: DC
  Administered 2015-07-26: 2 [IU] via SUBCUTANEOUS

## 2015-07-23 MED ORDER — BENAZEPRIL HCL 40 MG PO TABS
40.0000 mg | ORAL_TABLET | Freq: Every day | ORAL | Status: DC
  Administered 2015-07-24: 40 mg via ORAL
  Filled 2015-07-23: qty 1

## 2015-07-23 MED ORDER — PANTOPRAZOLE SODIUM 40 MG PO TBEC
40.0000 mg | DELAYED_RELEASE_TABLET | Freq: Every day | ORAL | Status: DC
  Administered 2015-07-24 – 2015-07-27 (×4): 40 mg via ORAL
  Filled 2015-07-23 (×4): qty 1

## 2015-07-23 MED ORDER — ACETAMINOPHEN 325 MG PO TABS
650.0000 mg | ORAL_TABLET | ORAL | Status: DC | PRN
  Administered 2015-07-24: 650 mg via ORAL
  Filled 2015-07-23: qty 2

## 2015-07-23 MED ORDER — ENOXAPARIN SODIUM 40 MG/0.4ML ~~LOC~~ SOLN
40.0000 mg | Freq: Every day | SUBCUTANEOUS | Status: DC
  Administered 2015-07-24: 40 mg via SUBCUTANEOUS
  Filled 2015-07-23: qty 0.4

## 2015-07-23 MED ORDER — TRAMADOL HCL 50 MG PO TABS
50.0000 mg | ORAL_TABLET | Freq: Four times a day (QID) | ORAL | Status: DC | PRN
  Administered 2015-07-24 – 2015-07-25 (×3): 50 mg via ORAL
  Filled 2015-07-23 (×3): qty 1

## 2015-07-23 MED ORDER — ALPRAZOLAM 0.25 MG PO TABS
0.2500 mg | ORAL_TABLET | Freq: Three times a day (TID) | ORAL | Status: DC | PRN
  Administered 2015-07-24 – 2015-07-26 (×4): 0.25 mg via ORAL
  Filled 2015-07-23 (×4): qty 1

## 2015-07-23 MED ORDER — ONDANSETRON HCL 4 MG/2ML IJ SOLN
4.0000 mg | Freq: Four times a day (QID) | INTRAMUSCULAR | Status: DC | PRN
  Administered 2015-07-24: 4 mg via INTRAVENOUS
  Filled 2015-07-23: qty 2

## 2015-07-23 MED ORDER — ASPIRIN EC 81 MG PO TBEC
81.0000 mg | DELAYED_RELEASE_TABLET | Freq: Every day | ORAL | Status: DC
  Administered 2015-07-24 – 2015-07-27 (×4): 81 mg via ORAL
  Filled 2015-07-23 (×4): qty 1

## 2015-07-23 MED ORDER — AMLODIPINE BESYLATE 10 MG PO TABS
10.0000 mg | ORAL_TABLET | Freq: Every day | ORAL | Status: DC
  Administered 2015-07-24: 10 mg via ORAL
  Filled 2015-07-23: qty 1

## 2015-07-23 MED ORDER — ROSUVASTATIN CALCIUM 20 MG PO TABS
20.0000 mg | ORAL_TABLET | Freq: Every day | ORAL | Status: DC
  Administered 2015-07-24: 20 mg via ORAL
  Filled 2015-07-23: qty 1

## 2015-07-23 MED ORDER — SODIUM CHLORIDE 0.9 % IV SOLN: 250.0000 mL | INTRAVENOUS | Status: DC | PRN

## 2015-07-23 NOTE — Clinical Social Work Note (Signed)
Clinical Social Work Assessment  Patient Details  Name: Amanda Gentry MRN: [id number] Date of Birth: [birth year]  Date of referral:  07/23/15               Reason for consult:   (Patient is interested in facility. )                Permission sought to share information with:   (None.) Permission granted to share information::  No  Name::        Agency::     Relationship::     Contact Information:     Housing/Transportation Living arrangements for the past 2 months:  Single Family Home (Patient informed CSW that she lives at home in [place] wtih her husband. ) Source of Information:  Patient Patient Interpreter Needed:  None Criminal Activity/Legal Involvement Pertinent to Current Situation/Hospitalization:  No - Comment as needed Significant Relationships:  Spouse, Adult Children, Siblings Lives with:  Spouse Do you feel safe going back to the place where you live?   (Patient states that she is interested in facility.) Need for family participation in patient care:  Yes (Comment) (Patient inforemed CSW that her primary support consist of her husband, daughter, and sister. )  Care giving concerns:  Patient informed CSW that she comes from home. Patient states that she lives with her husband in [place], and that he, her sister, and daughter are her primary support. However, patient states that she believes she needs more help. Patient states that she does not receive home health at this time, but says she has had [facility] in the past. Patient states that she is interested in SNF.    Social Worker assessment / plan:  CSW met with patient at bedside. There was no family present. Patient confirms she presents to [facility] due to leg swelling and weakness. Patient states that she comes from home. She states that she has been completing her ADL's independently.   Per note, famlily member states, patient sits in her recliner at home and doesn't move. She reports the patient doesn't take her  lasix because it makes her go to the bathroom too much. Patient has a walker and a bedside commode at home per family member.  Employment status:  Retired Forensic scientist:   Production designer, theatre/television/film.) PT Recommendations:  Not assessed at this time Information / Referral to community resources:   (CSW gave patient a list of facilities.)  Patient/Family's Response to care:  Patient is aware that she will be admitted. Patient is appropriate at this time.   Patient/Family's Understanding of and Emotional Response to Diagnosis, Current Treatment, and Prognosis:  Patient is understanding at this time. She states that she does not have any questions for CSW.  CSW provided patient with a list of SNF.  Emotional Assessment Appearance:  Appears stated age Attitude/Demeanor/Rapport:   (Appropriate.) Affect (typically observed):  Accepting, Appropriate Orientation:  Oriented to Self, Oriented to Place, Oriented to  Time, Oriented to Situation Alcohol / Substance use:  Not Applicable Psych involvement (Current and /or in the community):  No (Comment)  Discharge Needs  Concerns to be addressed:  No discharge needs identified Readmission within the last 30 days:  No Current discharge risk:  None Barriers to Discharge:  No Barriers Identified   Amanda Buffy, LCSW 07/23/2015, 9:52 PM

## 2015-07-23 NOTE — ED Notes (Signed)
Daniel RT contacted to obtain ABG

## 2015-07-23 NOTE — ED Notes (Signed)
Pt's sats RA is 87%.  Benjamine Mola EDPA aware

## 2015-07-23 NOTE — ED Notes (Signed)
New dressing applied to BLE.

## 2015-07-23 NOTE — ED Notes (Signed)
Per pt:  Edema has gotten a lot worse over the last year.  Was seen at PCP today and bilateral legs were examined and dressed.  Family unable to wrap and dress legs as ordered by wound care.  PCP sent to ED for further evaluation and possible placement for rehab vs SNF.

## 2015-07-23 NOTE — ED Notes (Signed)
Patient is resting comfortably back in room,

## 2015-07-23 NOTE — Progress Notes (Signed)
Referred by:  Biagio Borg, MD [address], [place] [address]  Reason for referral: right leg ulcers and buttock ulcer  History of Present Illness  Amanda Gentry is a 69 y.o. 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  Since the sarcoma resection, the patient has developed progressive swelling right leg with abnormal tissue growth.  She also notes swelling in left leg.  This patient is essentially bed bound at this point and is not able to ambulated.  The patient has had no history of DVT, known history of pregnancy, no history of varicose vein, no history of venous stasis ulcers, known history of  Lymphedema and known history of skin changes in lower legs.  There is no family history of venous disorders.  The patient has not been using compression due to the abnormal anatomy in in her right leg.  Past Medical History  Diagnosis Date  . ANXIETY 09/23/2007  . AORTIC INSUFFICIENCY 09/23/2007  . DEPRESSION 09/23/2007  . DIABETES MELLITUS, TYPE II 09/23/2007  . GERD 09/23/2007  . HYPERLIPIDEMIA 09/23/2007  . HYPERTENSION 09/23/2007  . HYPERTHYROIDISM 09/23/2007  . INSOMNIA 09/23/2007  . KNEE PAIN, RIGHT, CHRONIC 03/14/2008  . LOW BACK PAIN 09/23/2007  . Morbid obesity 09/23/2007  . OSTEOARTHRITIS 09/23/2007  . PERIPHERAL EDEMA 09/23/2007  . VITAMIN D DEFICIENCY 03/31/2010  . Degenerative arthritis of right knee 01/28/2012  . Acquired lymphedema of leg 01/29/2015    Past Surgical History  Procedure Laterality Date  . Abdominal hysterectomy    . Cholecystectomy    . Oophorectomy    Right thigh sarcoma resection ? ORIF R tib-fib C-section x 2  Social History   Social History  . Marital Status: Married    Spouse Name: N/A  . Number of Children: N/A  . Years of Education: N/A   Occupational History  . Not on file.   Social History Main Topics  . Smoking status: Never Smoker    . Smokeless tobacco: Not on file  . Alcohol Use: No  . Drug Use: No  . Sexual Activity: Not on file   Other Topics Concern  . Not on file   Social History Narrative    Family History  Problem Relation Age of Onset  . Hypertension Other   . Cancer Mother   . Heart disease Father   . Heart attack Father     Current Facility-Administered Medications on File Prior to Visit  Medication Dose Route Frequency Provider Last Rate Last Dose  . pneumococcal 13-valent conjugate vaccine (PREVNAR 13) injection 0.5 mL  0.5 mL Intramuscular Tomorrow-1000 Biagio Borg, MD       Current Outpatient Prescriptions on File Prior to Visit  Medication Sig Dispense Refill  . ALPRAZolam (XANAX) 0.5 MG tablet take 1 tablet by mouth once daily if needed for anxiety 30 tablet 5  . amLODipine (NORVASC) 10 MG tablet Take 1 tablet (10 mg total) by mouth daily. 90 tablet 3  . aspirin 81 MG tablet Take 81 mg by mouth daily.      . benazepril (LOTENSIN) 40 MG tablet Take 1 tablet (40 mg total) by mouth daily. 90 tablet 3  . diclofenac (VOLTAREN) 75 MG EC tablet take 1 tablet by mouth twice a day if needed 60 tablet 3  . levofloxacin (LEVAQUIN) 250 MG tablet Take 1 tablet (250 mg total) by mouth daily. 10 tablet  0  . levothyroxine (SYNTHROID, LEVOTHROID) 100 MCG tablet Take 1 tablet (100 mcg total) by mouth daily. 90 tablet 3  . metFORMIN (GLUCOPHAGE) 500 MG tablet Take 1 tablet (500 mg total) by mouth 2 (two) times daily with a meal. 180 tablet 3  . omeprazole (PRILOSEC) 20 MG capsule Take 1 capsule (20 mg total) by mouth daily. 90 capsule 3  . pioglitazone (ACTOS) 45 MG tablet Take 1 tablet (45 mg total) by mouth daily. 90 tablet 3  . rosuvastatin (CRESTOR) 20 MG tablet Take 1 tablet (20 mg total) by mouth daily. 90 tablet 3  . traMADol (ULTRAM) 50 MG tablet take 1 tablet by mouth four times a day if needed 120 tablet 5  . escitalopram (LEXAPRO) 10 MG tablet Take 1 tablet (10 mg total) by mouth daily. 90 tablet  3  . hydrochlorothiazide (HYDRODIURIL) 25 MG tablet Take 1 tablet (25 mg total) by mouth daily. (Patient not taking: Reported on 07/23/2015) 90 tablet 3  . HYDROcodone-homatropine (HYCODAN) 5-1.5 MG/5ML syrup Take 5 mLs by mouth every 6 (six) hours as needed for cough. (Patient not taking: Reported on 07/23/2015) 180 mL 0  . Lancets MISC Use as directed once daily     . predniSONE (DELTASONE) 10 MG tablet 3 tabs by mouth per day for 3 days,2tabs per day for 3 days,1tab per day for 3 days (Patient not taking: Reported on 07/23/2015) 18 tablet 0    Allergies  Allergen Reactions  . Lipitor [Atorvastatin]     Generic capsule causes bad taste in mouth    REVIEW OF SYSTEMS:  (Positives checked otherwise negative)  CARDIOVASCULAR:  []  chest pain, []  chest pressure, []  palpitations, [x]  shortness of breath when laying flat, [x]  shortness of breath with exertion,  [x]  pain in feet when walking, [x]  pain in feet when laying flat, []  history of blood clot in veins (DVT), []  history of phlebitis, [x]  swelling in legs, []  varicose veins  PULMONARY:  []  productive cough, []  asthma, []  wheezing  NEUROLOGIC:  [x]  weakness in arms or legs, []  numbness in arms or legs, []  difficulty speaking or slurred speech, []  temporary loss of vision in one eye, []  dizziness  HEMATOLOGIC:  []  bleeding problems, []  problems with blood clotting too easily  MUSCULOSKEL:  [x]  joint pain, []  joint swelling  GASTROINTEST:  []  vomiting blood, []  blood in stool     GENITOURINARY:  []  burning with urination, []  blood in urine  PSYCHIATRIC:  [x]  history of major depression  INTEGUMENTARY:  []  rashes, [x]  ulcers  CONSTITUTIONAL:  []  fever, []  chills   Physical Examination Filed Vitals:   07/23/15 1502  BP: 121/55  Pulse: 75  Temp: 97.4 F (36.3 C)  TempSrc: Oral  Height: 5\' 2"  (1.575 m)  Weight: 310 lb (140.615 kg)  SpO2: 92%   Body mass index is 56.69 kg/(m^2).  General: A&O x 3, WD, morbidly obese  Head:  [place]/AT  Ear/Nose/Throat: Hearing grossly intact, nares w/o erythema or drainage, oropharynx w/o Erythema/Exudate  Eyes: PERRLA, EOMI  Neck: Supple, no nuchal rigidity, no palpable LAD  Pulmonary: Sym exp, good air movt, distant BS, no rales and rhonchi, faint wheezing  Cardiac: RRR, Nl S1, S2, +Murmurs, rubs or gallops  Vascular: Vessel Right Left  Radial Palpable Palpable  Brachial Palpable Palpable  Carotid Palpable, without bruit Palpable, without bruit  Aorta Not palpable due to obesity N/A  Femoral Not Palpable due to obesity Not Palpable due to obesity  Popliteal Not palpable  Not palpable  PT Not Palpable Not Palpable  DP Not Palpable Not Palpable   Gastrointestinal: soft, large pannus, NTND, -G/R, - HSM, - masses, - CVAT B, R buttock stage I decubitus ulcer: small amount of denuded skin with limited bleeding  Musculoskeletal: BUE MS 3-4/5, unable to move legs, healed incisions in R upper thigh, fungating large growth in upper thigh, woody right lower leg skin, lateral shin ulcers with serous drainage, Left calf with some anterior woody skin changes, negative Kaposi-Stemmer signs  Neurologic: Limited exam, due to physical limitation, Pain and light touch intact in extremities , Motor exam as listed above  Psychiatric: Judgment intact, Mood & affect appropriate for pt's clinical situation  Dermatologic: See M/S exam for extremity exam, no rashes otherwise noted  Lymph : No Cervical, Axillary, or Inguinal lymphadenopathy    Non-Invasive Vascular Imaging  BLE Venous Insufficiency Duplex (Date: 07/23/2015):   Technically difficult exam due to anatomy  RLE: no DVT and SVT, + GSV reflux: proximal vein, + deep venous reflux: CFV  LLE: no DVT and SVT, no GSV reflux, + deep venous reflux: CFV   Outside Studies/Documentation 2 pages of outside documents were reviewed including: outpatient PCP.  Medical Decision Making  BRANDYN THIEN is a 69 y.o. 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  I would also recommend: R thigh MRI.  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  Further work-up will depend on the MRI findings.  Patient will likely need SNF placement for assistance with wound care, rehab, and ADLs.  The family is already saying they are not able to care for the patient.  Based on the difficulty examining this patient, I suspect this to be true.  Thank you for allowing [place] to participate in this patient's care.  Adele Barthel, MD [facility] Office: [phone] Pager: [phone]  07/23/2015, 4:46 PM

## 2015-07-23 NOTE — ED Provider Notes (Signed)
CSN: [id number]     Arrival date & time 07/23/15  1626 History   First MD Initiated Contact with Patient 07/23/15 1640     Chief Complaint  Patient presents with  . Leg Swelling  . Weakness    HPI   Amanda Gentry is a 69 y.o. female with a PMH of DM, HLD, HTN, obesity, lymphedema who presents to the ED with worsening lower extremity edema. She reports her edema has generally worsened over the past year, and has more recently worsened over the past 3-4 months. She denies precipitating factors. She has tried compression wraps for symptom relief, which she does not use very consistently. She was seen by her vascular surgeon today, who sent her to the ED for possible rehab vs SNF placement. She denies fever, chills, headache, lightheadedness, dizziness. She reports worsening shortness of breath x 3-4 months, which is exacerbated by activity and relieved with rest. She states she has difficulty walking without becoming short of breath. She is not on oxygen at home. She denies chest pain, abdominal pain, vomiting, diarrhea. She reports she felt nauseous this morning, and she states she has intermittent constipation, which is unchanged from baseline.    Past Medical History  Diagnosis Date  . ANXIETY 09/23/2007  . AORTIC INSUFFICIENCY 09/23/2007  . DEPRESSION 09/23/2007  . DIABETES MELLITUS, TYPE II 09/23/2007  . GERD 09/23/2007  . HYPERLIPIDEMIA 09/23/2007  . HYPERTENSION 09/23/2007  . HYPERTHYROIDISM 09/23/2007  . INSOMNIA 09/23/2007  . KNEE PAIN, RIGHT, CHRONIC 03/14/2008  . LOW BACK PAIN 09/23/2007  . Morbid obesity 09/23/2007  . OSTEOARTHRITIS 09/23/2007  . PERIPHERAL EDEMA 09/23/2007  . VITAMIN D DEFICIENCY 03/31/2010  . Degenerative arthritis of right knee 01/28/2012  . Acquired lymphedema of leg 01/29/2015   Past Surgical History  Procedure Laterality Date  . Abdominal hysterectomy    . Cholecystectomy    . Oophorectomy     Family History  Problem Relation Age of Onset  . Hypertension Other    . Cancer Mother   . Heart disease Father   . Heart attack Father    Social History  Substance Use Topics  . Smoking status: Never Smoker   . Smokeless tobacco: None  . Alcohol Use: No   OB History    No data available      Review of Systems  Constitutional: Positive for fatigue. Negative for fever, chills, activity change and appetite change.  HENT: Negative for congestion.   Eyes: Negative for visual disturbance.  Respiratory: Negative for cough and shortness of breath.   Cardiovascular: Positive for leg swelling. Negative for chest pain and palpitations.  Gastrointestinal: Positive for nausea and constipation. Negative for vomiting, abdominal pain, diarrhea and abdominal distention.  Genitourinary: Negative for dysuria, urgency and frequency.  Musculoskeletal: Negative for myalgias, back pain, arthralgias, neck pain and neck stiffness.  Skin: Positive for color change, rash and wound. Negative for pallor.       Reports color change and rash to right lower extremity. Reports wound to right buttock.  Neurological: Negative for dizziness, syncope, weakness, light-headedness, numbness and headaches.  Hematological: Bruises/bleeds easily.  All other systems reviewed and are negative.     Allergies  Lipitor  Home Medications   Prior to Admission medications   Medication Sig Start Date End Date Taking? Authorizing Provider  acetaminophen (TYLENOL) 500 MG tablet Take 1,000 mg by mouth every 6 (six) hours as needed for moderate pain.   Yes Historical Provider, MD  ALPRAZolam Duanne Moron) 0.5 MG  tablet take 1 tablet by mouth once daily if needed for anxiety 02/18/15  Yes Biagio Borg, MD  amLODipine (NORVASC) 10 MG tablet Take 1 tablet (10 mg total) by mouth daily. 08/28/14  Yes Biagio Borg, MD  aspirin 81 MG tablet Take 81 mg by mouth daily.     Yes Historical Provider, MD  benazepril (LOTENSIN) 40 MG tablet Take 1 tablet (40 mg total) by mouth daily. 08/28/14  Yes Biagio Borg, MD   diclofenac (VOLTAREN) 75 MG EC tablet take 1 tablet by mouth twice a day if needed Patient taking differently: take 1 tablet by mouth twice a day if needed pain 01/07/15  Yes Biagio Borg, MD  escitalopram (LEXAPRO) 10 MG tablet Take 1 tablet (10 mg total) by mouth daily. 01/29/15 07/23/15 Yes Biagio Borg, MD  levothyroxine (SYNTHROID, LEVOTHROID) 100 MCG tablet Take 1 tablet (100 mcg total) by mouth daily. 08/28/14  Yes Biagio Borg, MD  metFORMIN (GLUCOPHAGE) 500 MG tablet Take 1 tablet (500 mg total) by mouth 2 (two) times daily with a meal. Patient taking differently: Take 500 mg by mouth daily.  08/28/14  Yes Biagio Borg, MD  Multiple Vitamins-Minerals (CENTRUM SILVER PO) Take 1 tablet by mouth daily.   Yes Historical Provider, MD  omeprazole (PRILOSEC) 20 MG capsule Take 1 capsule (20 mg total) by mouth daily. 10/23/13  Yes Biagio Borg, MD  pioglitazone (ACTOS) 45 MG tablet Take 1 tablet (45 mg total) by mouth daily. 08/28/14  Yes Biagio Borg, MD  rosuvastatin (CRESTOR) 20 MG tablet Take 1 tablet (20 mg total) by mouth daily. 08/28/14  Yes Biagio Borg, MD  traMADol Veatrice Bourbon) 50 MG tablet take 1 tablet by mouth four times a day if needed Patient taking differently: take 1 tablet by mouth four times a day if needed pain 07/15/15  Yes Biagio Borg, MD  hydrochlorothiazide (HYDRODIURIL) 25 MG tablet Take 1 tablet (25 mg total) by mouth daily. Patient not taking: Reported on 07/23/2015 08/28/14   Biagio Borg, MD  levofloxacin (LEVAQUIN) 250 MG tablet Take 1 tablet (250 mg total) by mouth daily. Patient not taking: Reported on 07/23/2015 08/28/14   Biagio Borg, MD  predniSONE (DELTASONE) 10 MG tablet 3 tabs by mouth per day for 3 days,2tabs per day for 3 days,1tab per day for 3 days Patient not taking: Reported on 07/23/2015 08/28/14   Biagio Borg, MD    BP 110/59 mmHg  Pulse 83  Temp(Src) 98.7 F (37.1 C) (Oral)  Resp 22  SpO2 93% Physical Exam  Constitutional: She is oriented to person, place,  and time. No distress.  Obese female in no acute distress.  HENT:  Head: Normocephalic and atraumatic.  Right Ear: External ear normal.  Left Ear: External ear normal.  Nose: Nose normal.  Mouth/Throat: Oropharynx is clear and moist.  Eyes: Conjunctivae and EOM are normal. Pupils are equal, round, and reactive to light. Right eye exhibits no discharge. Left eye exhibits no discharge. No scleral icterus.  Neck: Normal range of motion. Neck supple.  Cardiovascular: Normal rate, regular rhythm, normal heart sounds and intact distal pulses.   3/6 systolic murmur, loudest over RUSB.  Pulmonary/Chest: Effort normal. No respiratory distress. She has wheezes.  End expiratory wheezes in upper lung fields bilaterally.  Abdominal: Soft. Bowel sounds are normal. She exhibits no distension and no mass. There is no tenderness. There is no rebound and no guarding.  Musculoskeletal: Normal range  of motion. She exhibits edema. She exhibits no tenderness.  Neurological: She is alert and oriented to person, place, and time.  Skin: Skin is warm and dry. Rash noted. Rash is nodular. She is not diaphoretic. There is erythema. No pallor.  Erythematous nodular mass to right lower extremity over medial aspect of thigh.   Psychiatric: She has a normal mood and affect. Her behavior is normal. Judgment and thought content normal.    ED Course  Procedures (including critical care time)  Labs Review Labs Reviewed  CBC WITH DIFFERENTIAL/PLATELET - Abnormal; Notable for the following:    Hemoglobin 11.5 (*)    RDW 17.7 (*)    Neutrophils Relative % 85 (*)    Lymphocytes Relative 7 (*)    Lymphs Abs 0.5 (*)    All other components within normal limits  BASIC METABOLIC PANEL - Abnormal; Notable for the following:    Chloride 113 (*)    Glucose, Bld 109 (*)    BUN 33 (*)    Creatinine, Ser 1.02 (*)    Calcium 8.8 (*)    GFR calc non Af Amer 55 (*)    All other components within normal limits  BRAIN  NATRIURETIC PEPTIDE - Abnormal; Notable for the following:    B Natriuretic Peptide 222.4 (*)    All other components within normal limits  URINALYSIS, ROUTINE W REFLEX MICROSCOPIC (NOT AT [facility]) - Abnormal; Notable for the following:    APPearance CLOUDY (*)    All other components within normal limits  CBG MONITORING, ED - Abnormal; Notable for the following:    Glucose-Capillary 101 (*)    All other components within normal limits   Imaging Review Dg Chest 2 View  07/23/2015   CLINICAL DATA:  Shortness of breath for 2 days, lymphedema, hypertension  EXAM: CHEST  2 VIEW  COMPARISON:  01/28/2012  FINDINGS: Moderate enlargement of the cardiac silhouette is noted with central vascular congestion, patchy perihilar opacities, and underlying interstitial prominence. Small pleural effusions are noted. No acute osseous finding.  IMPRESSION: Cardiomegaly with probable early alveolar edema superimposed on interstitial edema although other alveolar filling processes could appear similar.   Electronically Signed   By: Conchita Paris M.D.   On: 07/23/2015 18:38     I have personally reviewed and evaluated these images and lab results as part of my medical decision-making.   EKG Interpretation None      MDM   Final diagnoses:  Lymphedema  SOB (shortness of breath)  Hypoxia  Immobility    69 year old female presents with worsening lower extremity edema x 3-4 months and shortness of breath x 3-4 months. Was seen by vascular surgeon in clinic today. Denies fever, chills, headache, lightheadedness, dizziness. Denies chest pain, abdominal pain, vomiting, diarrhea. Reports nodular mass to right lower extremity over medial aspect of thigh x 3-4 months.   Patient not on oxygen at home, on 2L in the ED with O2 sat 93-95%. Patient is afebrile. On exam, end expiratory wheezing to upper lung fields bilaterally, lower extremity edema bilaterally with nodular mass to medial aspect of right thigh. Appears  volume overloaded.    CBC with mild anemia, negative for leukocytosis. BNP elevated at 222. UA negative for infection. CXR reveals cardiomegaly with early alveolar edema and interstitial edema.  Compression dressings removed for exam and subsequently re-applied. O2 sat on RA 87%. Given new oxygen requirement, hospitalist consulted.   Patient discussed with and seen by Dr. Colin Rhein.  Patient discussed  with hospitalist Dr. Posey Pronto, who will see the patient in the ED. Patient is to be admitted.  BP 110/59 mmHg  Pulse 83  Temp(Src) 98.7 F (37.1 C) (Oral)  Resp 22  SpO2 93%     Marella Chimes, PA-C 07/24/15 [place], PA-C 07/24/15 0037  Debby Freiberg, MD 07/25/15 [phone]

## 2015-07-23 NOTE — ED Notes (Signed)
Bed: WA20 Expected date:  Expected time:  Means of arrival:  Comments: EMS- 68yo F, BLE swelling

## 2015-07-23 NOTE — Telephone Encounter (Signed)
Sorry, but I would not know how to do this, as this is outside the scope of primary care practice, and I have not done this before.  The only other options would be Home Health with wound care, or the wound care clinic, so let me know.

## 2015-07-23 NOTE — Telephone Encounter (Signed)
Daughter states Dr. Jenny Reichmann referred mom to see Dr. Geryl Councilman concerning wounds on her legs. Dr. Geryl Councilman states mom need to be admitted to a rehab facility to work on the wounds on her leg, if she don't they will have to amputate her leg. Family is wanting md to refer her to a rehab facility...Johny Chess

## 2015-07-23 NOTE — ED Notes (Signed)
Patient transported to X-ray 

## 2015-07-23 NOTE — ED Notes (Addendum)
Pt from home.  Transported by [employer].  Pt c/o increased edema in bilateral LE.  Right leg worse than left.  Pt "able" to ambulate a couple steps.  VS: O2 94 room air PR 90 RR 16 CBG 101 afebrile; unable to provide BP d/t size of arm per EMS.

## 2015-07-23 NOTE — Progress Notes (Signed)
EDCM spoke to patient and family at bedside.  Patient lives at home.  Patient noted to be wearing oxygen in the ED, patient does not wear oxygen at home.  Patient reports she has had [facility] home health services in the past for RN for dressing changes and PT.  EDCM explained home health services to patient and family.  EDCM provided patient with list of home health agencies in [facility] highlighting [place].  Patient thankful for services.  EDCM was pulled aside outside patient's room by female family member.  Per famlily member, patient sits in her recliner at home and doesn't move.  She reports the patient doesn't take her lasix because it makes her go to the bathroom too much.  Patient has a walker and a bedside commode at home per family member.  Patient and family told patient needed [place] to rehab facility.  EDCM informed patient and her family that social worker can be added to home health services to assist with placement from home.  The [facility] discussed patient with EDSW.  Awaiting disposition.  No further EDCM needs at this time.

## 2015-07-24 ENCOUNTER — Encounter (HOSPITAL_COMMUNITY): Payer: Self-pay | Admitting: Radiology

## 2015-07-24 ENCOUNTER — Inpatient Hospital Stay (HOSPITAL_COMMUNITY): Payer: Medicare Other

## 2015-07-24 DIAGNOSIS — C499 Malignant neoplasm of connective and soft tissue, unspecified: Secondary | ICD-10-CM | POA: Diagnosis present

## 2015-07-24 DIAGNOSIS — L8990 Pressure ulcer of unspecified site, unspecified stage: Secondary | ICD-10-CM

## 2015-07-24 DIAGNOSIS — I509 Heart failure, unspecified: Secondary | ICD-10-CM

## 2015-07-24 LAB — MAGNESIUM: MAGNESIUM: 2.2 mg/dL (ref 1.7–2.4)

## 2015-07-24 LAB — COMPREHENSIVE METABOLIC PANEL
ALT: 11 U/L — ABNORMAL LOW (ref 14–54)
AST: 21 U/L (ref 15–41)
Albumin: 3.3 g/dL — ABNORMAL LOW (ref 3.5–5.0)
Alkaline Phosphatase: 63 U/L (ref 38–126)
Anion gap: 3 — ABNORMAL LOW (ref 5–15)
BUN: 33 mg/dL — ABNORMAL HIGH (ref 6–20)
CHLORIDE: 112 mmol/L — AB (ref 101–111)
CO2: 24 mmol/L (ref 22–32)
Calcium: 8.7 mg/dL — ABNORMAL LOW (ref 8.9–10.3)
Creatinine, Ser: 1.01 mg/dL — ABNORMAL HIGH (ref 0.44–1.00)
GFR, EST NON AFRICAN AMERICAN: 55 mL/min — AB (ref >60)
Glucose, Bld: 117 mg/dL — ABNORMAL HIGH (ref 65–99)
POTASSIUM: 5.5 mmol/L — AB (ref 3.5–5.1)
Sodium: 139 mmol/L (ref 135–145)
TOTAL PROTEIN: 6.9 g/dL (ref 6.5–8.1)
Total Bilirubin: 0.4 mg/dL (ref 0.3–1.2)

## 2015-07-24 LAB — C-REACTIVE PROTEIN: CRP: 0.7 mg/dL (ref <1.0)

## 2015-07-24 LAB — CBC WITH DIFFERENTIAL/PLATELET
Basophils Absolute: 0 10*3/uL (ref 0.0–0.1)
Basophils Relative: 0 % (ref 0–1)
EOS PCT: 1 % (ref 0–5)
Eosinophils Absolute: 0.1 10*3/uL (ref 0.0–0.7)
HCT: 37.3 % (ref 36.0–46.0)
Hemoglobin: 11.2 g/dL — ABNORMAL LOW (ref 12.0–15.0)
LYMPHS ABS: 0.6 10*3/uL — AB (ref 0.7–4.0)
LYMPHS PCT: 10 % — AB (ref 12–46)
MCH: 27.8 pg (ref 26.0–34.0)
MCHC: 30 g/dL (ref 30.0–36.0)
MCV: 92.6 fL (ref 78.0–100.0)
MONO ABS: 0.2 10*3/uL (ref 0.1–1.0)
MONOS PCT: 4 % (ref 3–12)
Neutro Abs: 5.5 10*3/uL (ref 1.7–7.7)
Neutrophils Relative %: 85 % — ABNORMAL HIGH (ref 43–77)
PLATELETS: 165 10*3/uL (ref 150–400)
RBC: 4.03 MIL/uL (ref 3.87–5.11)
RDW: 18 % — AB (ref 11.5–15.5)
WBC: 6.4 10*3/uL (ref 4.0–10.5)

## 2015-07-24 LAB — GLUCOSE, CAPILLARY
GLUCOSE-CAPILLARY: 120 mg/dL — AB (ref 65–99)
GLUCOSE-CAPILLARY: 114 mg/dL — AB (ref 65–99)
Glucose-Capillary: 110 mg/dL — ABNORMAL HIGH (ref 65–99)
Glucose-Capillary: 140 mg/dL — ABNORMAL HIGH (ref 65–99)

## 2015-07-24 LAB — HEPATIC FUNCTION PANEL
ALBUMIN: 3.2 g/dL — AB (ref 3.5–5.0)
ALT: 9 U/L — AB (ref 14–54)
AST: 19 U/L (ref 15–41)
Alkaline Phosphatase: 61 U/L (ref 38–126)
Bilirubin, Direct: 0.1 mg/dL (ref 0.1–0.5)
Indirect Bilirubin: 0.4 mg/dL (ref 0.3–0.9)
TOTAL PROTEIN: 6.7 g/dL (ref 6.5–8.1)
Total Bilirubin: 0.5 mg/dL (ref 0.3–1.2)

## 2015-07-24 LAB — TSH: TSH: 2.051 u[IU]/mL (ref 0.350–4.500)

## 2015-07-24 LAB — SEDIMENTATION RATE: Sed Rate: 36 mm/hr — ABNORMAL HIGH (ref 0–22)

## 2015-07-24 MED ORDER — ENOXAPARIN SODIUM 80 MG/0.8ML ~~LOC~~ SOLN
80.0000 mg | Freq: Every day | SUBCUTANEOUS | Status: DC
  Administered 2015-07-24: 80 mg via SUBCUTANEOUS
  Filled 2015-07-24 (×2): qty 0.8

## 2015-07-24 MED ORDER — INFLUENZA VAC SPLIT QUAD 0.5 ML IM SUSY
0.5000 mL | PREFILLED_SYRINGE | INTRAMUSCULAR | Status: AC
  Administered 2015-07-25: 0.5 mL via INTRAMUSCULAR
  Filled 2015-07-24 (×2): qty 0.5

## 2015-07-24 MED ORDER — FUROSEMIDE 10 MG/ML IJ SOLN
40.0000 mg | Freq: Every day | INTRAMUSCULAR | Status: DC
  Administered 2015-07-24: 40 mg via INTRAVENOUS
  Filled 2015-07-24: qty 4

## 2015-07-24 MED ORDER — SODIUM CHLORIDE 0.9 % IV SOLN
2000.0000 mg | Freq: Once | INTRAVENOUS | Status: AC
  Administered 2015-07-24: 2000 mg via INTRAVENOUS
  Filled 2015-07-24: qty 2000

## 2015-07-24 MED ORDER — VANCOMYCIN HCL 10 G IV SOLR
1250.0000 mg | Freq: Two times a day (BID) | INTRAVENOUS | Status: DC
  Filled 2015-07-24: qty 1250

## 2015-07-24 MED ORDER — FUROSEMIDE 10 MG/ML IJ SOLN
40.0000 mg | Freq: Once | INTRAMUSCULAR | Status: AC
  Administered 2015-07-24: 40 mg via INTRAVENOUS
  Filled 2015-07-24: qty 4

## 2015-07-24 MED ORDER — PERFLUTREN LIPID MICROSPHERE
1.0000 mL | INTRAVENOUS | Status: AC | PRN
  Administered 2015-07-24: 4 mL via INTRAVENOUS
  Filled 2015-07-24: qty 10

## 2015-07-24 MED ORDER — IOHEXOL 350 MG/ML SOLN
100.0000 mL | Freq: Once | INTRAVENOUS | Status: AC | PRN
  Administered 2015-07-24: 100 mL via INTRAVENOUS

## 2015-07-24 NOTE — Progress Notes (Signed)
ANTIBIOTIC CONSULT NOTE - INITIAL  Pharmacy Consult for vancomycin Indication: cellulitis  Allergies  Allergen Reactions  . Lipitor [Atorvastatin]     Generic capsule causes bad taste in mouth    Patient Measurements: Height: 5\' 2"  (157.5 cm) Weight: (!) 367 lb (166.47 kg) IBW/kg (Calculated) : 50.1 Adjusted Body Weight:   Vital Signs: Temp: 97.9 F (36.6 C) (09/01 1450) Temp Source: Oral (09/01 1450) BP: 92/43 mmHg (09/01 1450) Pulse Rate: 68 (09/01 1450) Intake/Output from previous day: 08/31 0701 - 09/01 0700 In: -  Out: 400 [Urine:400] Intake/Output from this shift: Total I/O In: 480 [P.O.:480] Out: 200 [Urine:200]  Labs:  Recent Labs  07/23/15 1801 07/24/15 0825  WBC 7.2 6.4  HGB 11.5* 11.2*  PLT 163 165  CREATININE 1.02* 1.01*   Estimated Creatinine Clearance: 80.3 mL/min (by C-G formula based on Cr of 1.01). No results for input(s): VANCOTROUGH, VANCOPEAK, VANCORANDOM, GENTTROUGH, GENTPEAK, GENTRANDOM, TOBRATROUGH, TOBRAPEAK, TOBRARND, AMIKACINPEAK, AMIKACINTROU, AMIKACIN in the last 72 hours.   Microbiology: No results found for this or any previous visit (from the past 720 hour(s)).  Medical History: Past Medical History  Diagnosis Date  . ANXIETY 09/23/2007  . AORTIC INSUFFICIENCY 09/23/2007  . DEPRESSION 09/23/2007  . DIABETES MELLITUS, TYPE II 09/23/2007  . GERD 09/23/2007  . HYPERLIPIDEMIA 09/23/2007  . HYPERTENSION 09/23/2007  . HYPERTHYROIDISM 09/23/2007  . INSOMNIA 09/23/2007  . KNEE PAIN, RIGHT, CHRONIC 03/14/2008  . LOW BACK PAIN 09/23/2007  . Morbid obesity 09/23/2007  . OSTEOARTHRITIS 09/23/2007  . PERIPHERAL EDEMA 09/23/2007  . VITAMIN D DEFICIENCY 03/31/2010  . Degenerative arthritis of right knee 01/28/2012  . Acquired lymphedema of leg 01/29/2015   Assessment: 22 YOF presents with LE edema and non-healing wound/decubitus ulcer. Pharmacy asked to dose vancomycin. Appears this was ordered at [facility] 9/1 but order not released until 1800.   9/1  >>vancomycin   >>  9/1 blood:  SCr WNL, normalized CrCl  = 12ml/min WBC WNL afebrile  Dose changes/levels:  Goal of Therapy:  Vancomycin trough level 10-15 mcg/ml  Plan:   Vancomycin 2gm x 1 then 1250mg  IV q12h  Check trough at steady-state  Doreene Eland, PharmD, BCPS.   Pager: [phone] 07/24/2015,6:27 PM

## 2015-07-24 NOTE — Progress Notes (Signed)
Patient has low urine output since 3:00pm, and urine color is brown to red, N.P. Informed. No new orders. Will continue to monitor.

## 2015-07-24 NOTE — Telephone Encounter (Signed)
Notified pt daughter (laura) with md response. She stated that mom was admitted to [facility] on yesterday will follow-up once she has been discharge....Johny Chess

## 2015-07-24 NOTE — Progress Notes (Signed)
  Echocardiogram 2D Echocardiogram has been performed.  Amanda Gentry M 07/24/2015, 2:59 PM

## 2015-07-24 NOTE — Consult Note (Signed)
WOC wound consult note Reason for Consult: cellulitis at right hip and moisture associated skin damage, specifically intertriginous skin damage Wound type:Infectious and moisture, full thickness wound at RLE and aprtial thickness wound at right hip Pressure Ulcer POA: Yes Measurement:Area of cellulitis at right hip measures 20cm x 17cm with 4cm x 3cm c 0.2cm area of partial thickness tissue loss wee[ping serous exudate. Right heel with blanchable erythema measuring 4cm round.  Right posterior LE with full thickness tissue loss measuring 2cm round x 0.2cm with ruddy red base and moderate amount of serous exudate.  Intertriginous dermatitis at inframammaroy, inguinal, sub-pannicular and inter-gluteal cleft spaces with erythema, no open areas, but musty odor and caked powder noted.  Two areas of blanchable erythema on bilateral buttocks on either side of the gluteal cleft measuring 10cm x 3cm that present with a deeper hue of red, but not purple or maroon. Wound bed:As noted above. Drainage (amount, consistency, odor) As noted above. Periwound:As noted above. Dressing procedure/placement/frequency: Patient is on a therapeutic mattress that can accommodate her bariatric proportion.  She has lymphedema and chronic skin issues of the LEs that require fluid management via manual massage and ultimately compression garments. For this I suggest referral to either the outpatient rehab center at [facility] or the outpatient wound care center. If you agree, please order.  Topical care to the intertriginous areas has been provided via our house antimicrobial textile and bilateral heel pressure redistribution boots are provided.  Turning and repositioning guidance is provided. For the area of cellulitis on the right hip, a white petrolatum gauze dressing over the weeping area and an ABD to pad and protect is ordered. Thank you for this consultation.  The [place] nursing team will not follow, but will remain available to  this patient, the nursing, srugical and medical teams.  Please re-consult if needed. Thanks, Maudie Flakes, MSN, RN, [place], [place], [place] [phone])

## 2015-07-24 NOTE — H&P (Signed)
[facility] History and Physical  Patient: Amanda Gentry  MRN: [id number]  DOB: 1946/05/11  DOS: the patient was seen and examined on 07/23/2015 PCP: Cathlean Cower, MD  Referring physician: Dr Colin Rhein Chief Complaint:   HPI: Amanda Gentry is a 69 y.o. female with Past medical history of anxiety, morbid obesity, history of sarcoma, lymphedema of the leg, hypertension, dyslipidemia, GERD, diabetes type 2, depression, hypothyroidism. The patient is presenting with complaints of shortness of breath as well as nonhealing wounds. The patient was taken to vascular surgery today by Dr. as she has found that the patient has right cough ulcer as well as right buttock pain with purulent discharge. As per the patient and this ulcer has been present since last many months and has been progressively worsening. She has history of sarcoma resection of the right thigh and since then has developed lymphedema. She is not able to ambulate due to this swelling and significant leg pains. She is probably mostly bedbound and is using her recliner. As per the daughter, as per documentation by case manager the patient does not take her Lasix on regular basis as it makes her too urinate too often. Patient also had a fall a few weeks ago. Denies having any fever or chills no nausea no vomiting no cough no diarrhea no burning urination. She is also complaining of some shortness of breath which is also ongoing since last few months and has been progressively worsening. She denies having any cough with sputum production and complains that she has started having some wheezing over last few weeks.  The patient is coming from home.  At her baseline ambulates with support And is independent for most of her ADL manages her medication on her own.  Review of Systems: as mentioned in the history of present illness.  A comprehensive review of the other systems is negative.  Past Medical History  Diagnosis Date  .  ANXIETY 09/23/2007  . AORTIC INSUFFICIENCY 09/23/2007  . DEPRESSION 09/23/2007  . DIABETES MELLITUS, TYPE II 09/23/2007  . GERD 09/23/2007  . HYPERLIPIDEMIA 09/23/2007  . HYPERTENSION 09/23/2007  . HYPERTHYROIDISM 09/23/2007  . INSOMNIA 09/23/2007  . KNEE PAIN, RIGHT, CHRONIC 03/14/2008  . LOW BACK PAIN 09/23/2007  . Morbid obesity 09/23/2007  . OSTEOARTHRITIS 09/23/2007  . PERIPHERAL EDEMA 09/23/2007  . VITAMIN D DEFICIENCY 03/31/2010  . Degenerative arthritis of right knee 01/28/2012  . Acquired lymphedema of leg 01/29/2015   Past Surgical History  Procedure Laterality Date  . Abdominal hysterectomy    . Cholecystectomy    . Oophorectomy     Social History:  reports that she has never smoked. She does not have any smokeless tobacco history on file. She reports that she does not drink alcohol or use illicit drugs.  Allergies  Allergen Reactions  . Lipitor [Atorvastatin]     Generic capsule causes bad taste in mouth    Family History  Problem Relation Age of Onset  . Hypertension Other   . Cancer Mother   . Heart disease Father   . Heart attack Father     Prior to Admission medications   Medication Sig Start Date End Date Taking? Authorizing Provider  acetaminophen (TYLENOL) 500 MG tablet Take 1,000 mg by mouth every 6 (six) hours as needed for moderate pain.   Yes Historical Provider, MD  ALPRAZolam Duanne Moron) 0.5 MG tablet take 1 tablet by mouth once daily if needed for anxiety 02/18/15  Yes Biagio Borg, MD  amLODipine [facility]  10 MG tablet Take 1 tablet (10 mg total) by mouth daily. 08/28/14  Yes Biagio Borg, MD  aspirin 81 MG tablet Take 81 mg by mouth daily.     Yes Historical Provider, MD  benazepril (LOTENSIN) 40 MG tablet Take 1 tablet (40 mg total) by mouth daily. 08/28/14  Yes Biagio Borg, MD  diclofenac (VOLTAREN) 75 MG EC tablet take 1 tablet by mouth twice a day if needed Patient taking differently: take 1 tablet by mouth twice a day if needed pain 01/07/15  Yes Biagio Borg,  MD  escitalopram (LEXAPRO) 10 MG tablet Take 1 tablet (10 mg total) by mouth daily. 01/29/15 07/23/15 Yes Biagio Borg, MD  levothyroxine (SYNTHROID, LEVOTHROID) 100 MCG tablet Take 1 tablet (100 mcg total) by mouth daily. 08/28/14  Yes Biagio Borg, MD  metFORMIN (GLUCOPHAGE) 500 MG tablet Take 1 tablet (500 mg total) by mouth 2 (two) times daily with a meal. Patient taking differently: Take 500 mg by mouth daily.  08/28/14  Yes Biagio Borg, MD  Multiple Vitamins-Minerals (CENTRUM SILVER PO) Take 1 tablet by mouth daily.   Yes Historical Provider, MD  omeprazole (PRILOSEC) 20 MG capsule Take 1 capsule (20 mg total) by mouth daily. 10/23/13  Yes Biagio Borg, MD  pioglitazone (ACTOS) 45 MG tablet Take 1 tablet (45 mg total) by mouth daily. 08/28/14  Yes Biagio Borg, MD  rosuvastatin (CRESTOR) 20 MG tablet Take 1 tablet (20 mg total) by mouth daily. 08/28/14  Yes Biagio Borg, MD  traMADol Veatrice Bourbon) 50 MG tablet take 1 tablet by mouth four times a day if needed Patient taking differently: take 1 tablet by mouth four times a day if needed pain 07/15/15  Yes Biagio Borg, MD  hydrochlorothiazide (HYDRODIURIL) 25 MG tablet Take 1 tablet (25 mg total) by mouth daily. Patient not taking: Reported on 07/23/2015 08/28/14   Biagio Borg, MD  levofloxacin (LEVAQUIN) 250 MG tablet Take 1 tablet (250 mg total) by mouth daily. Patient not taking: Reported on 07/23/2015 08/28/14   Biagio Borg, MD  predniSONE (DELTASONE) 10 MG tablet 3 tabs by mouth per day for 3 days,2tabs per day for 3 days,1tab per day for 3 days Patient not taking: Reported on 07/23/2015 08/28/14   Biagio Borg, MD    Physical Exam: Filed Vitals:   07/23/15 2132 07/23/15 2200 07/24/15 0129 07/24/15 0532  BP: 111/58 108/50 124/51 94/45  Pulse:  74 86 76  Temp:   97.9 F (36.6 C) 97.7 F (36.5 C)  TempSrc:   Oral Oral  Resp: 20 19 20 16   Height:   5' 2"  (1.575 m)   Weight:   166.47 kg (367 lb)   SpO2: 95% 95% 94% 95%    General: Alert, Awake  and Oriented to Time, Place and Person. Appear in mild distress Eyes: PERRL ENT: Oral Mucosa clear moist. Neck: no JVD Cardiovascular: S1 and S2 Present, no Murmur, Peripheral Pulses Present Respiratory: Bilateral Air entry equal and Decreased,  Bilateral basal with expiratory wheezing and Crackles, Abdomen: Bowel Sound present, Soft and no tenderness Skin: no Rash Extremities: Diffuse lymphedema with difficulty identifying ulcer site due to limitation with the bed and sizable pannus, no calf tenderness Neurologic: Grossly no focal neuro deficit.  Labs on Admission:  CBC:  Recent Labs Lab 07/23/15 1801  WBC 7.2  NEUTROABS 6.2  HGB 11.5*  HCT 37.7  MCV 90.0  PLT 163    CMP  Component Value Date/Time   NA 141 07/23/2015 1801   K 5.0 07/23/2015 1801   CL 113* 07/23/2015 1801   CO2 23 07/23/2015 1801   GLUCOSE 109* 07/23/2015 1801   BUN 33* 07/23/2015 1801   CREATININE 1.02* 07/23/2015 1801   CALCIUM 8.8* 07/23/2015 1801   PROT 6.7 07/24/2015 0235   ALBUMIN 3.2* 07/24/2015 0235   AST 19 07/24/2015 0235   ALT 9* 07/24/2015 0235   ALKPHOS 61 07/24/2015 0235   BILITOT 0.5 07/24/2015 0235   GFRNONAA 55* 07/23/2015 1801   GFRAA >60 07/23/2015 1801    No results for input(s): LIPASE, AMYLASE in the last 168 hours.  No results for input(s): CKTOTAL, CKMB, CKMBINDEX, TROPONINI in the last 168 hours. BNP (last 3 results)  Recent Labs  07/23/15 1801  BNP 222.4*    ProBNP (last 3 results) No results for input(s): PROBNP in the last 8760 hours.   Radiological Exams on Admission: Dg Chest 2 View  07/23/2015   CLINICAL DATA:  Shortness of breath for 2 days, lymphedema, hypertension  EXAM: CHEST  2 VIEW  COMPARISON:  01/28/2012  FINDINGS: Moderate enlargement of the cardiac silhouette is noted with central vascular congestion, patchy perihilar opacities, and underlying interstitial prominence. Small pleural effusions are noted. No acute osseous finding.  IMPRESSION:  Cardiomegaly with probable early alveolar edema superimposed on interstitial edema although other alveolar filling processes could appear similar.   Electronically Signed   By: Conchita Paris M.D.   On: 07/23/2015 18:38   Ct Angio Chest Pe W/cm &/or Wo Cm  07/24/2015   CLINICAL DATA:  Worsening bilateral lower extremity edema. Hypoxia and shortness of breath, acute onset. Initial encounter.  EXAM: CT ANGIOGRAPHY CHEST WITH CONTRAST  TECHNIQUE: Multidetector CT imaging of the chest was performed using the standard protocol during bolus administration of intravenous contrast. Multiplanar CT image reconstructions and MIPs were obtained to evaluate the vascular anatomy.  CONTRAST:  179m OMNIPAQUE IOHEXOL 350 MG/ML SOLN  COMPARISON:  Chest radiograph performed earlier today at 6:23 p.m.  FINDINGS: There is no evidence of significant pulmonary embolus. Evaluation for pulmonary embolus is suboptimal in areas of airspace consolidation.  Trace bilateral pleural effusions are noted. Patchy bilateral central airspace opacities are seen, with interstitial prominence and vague ground-glass airspace opacities bilaterally, likely reflecting pulmonary edema. There is no evidence of pneumothorax.  The mediastinum is mildly enlarged. A 1.6 cm periaortic node is seen. Remaining visualized mediastinal nodes are borderline normal in size. The great vessels are grossly unremarkable in appearance. Incidental note is made of a direct origin of the left vertebral artery from the aortic arch. No pericardial effusion is identified. No axillary lymphadenopathy is seen. The thyroid gland is unremarkable in appearance.  The visualized portions of the liver and spleen are unremarkable. A small amount of ascites is noted about the liver and stomach.  No acute osseous abnormalities are seen. There is loss of the joint space at the left glenohumeral joint, with associated sclerotic change.  Review of the MIP images confirms the above findings.   IMPRESSION: 1. No evidence of significant pulmonary embolus. 2. Trace bilateral pleural effusions noted. Patchy bilateral central airspace opacities, with interstitial prominence and vague ground-glass airspace opacities bilaterally, likely reflecting pulmonary edema. 3. Mild cardiomegaly. 4. 1.6 cm periaortic node, of uncertain significance. 5. Small amount of ascites noted about the liver and stomach. 6. Degenerative change at the left glenohumeral joint.   Electronically Signed   By: JFrancoise SchaumannD.  On: 07/24/2015 01:05   EKG: Independently reviewed. atrial fibrillation, rate controlled.  Assessment/Plan Principal Problem:   Acute on chronic diastolic CHF (congestive heart failure) Active Problems:   Diabetes   Morbid obesity   Essential hypertension   GERD   KNEE PAIN, RIGHT, CHRONIC   Acquired lymphedema of leg   Chronic venous insufficiency   Sarcoma   Decubitus ulcer   1. Acute on chronic diastolic CHF (congestive heart failure) The patient is presenting with complaints of progressively worsening shortness of breath. Her BNP is mildly elevated. Chest x-ray shows cardiomegaly with congestion. EKG shows atrial fibrillation which is rate controlled. Family has mentions that the patient is not compliant with her Lasix. With this probable etiology is acute on chronic diastolic heart failure. We will give her one dose of IV Lasix 40 mg and resume her home dose of Lasix. Echogram in the morning.  2. atrial fibrillation rate controlled. CHA2DS2-VASc 5. The patient is having high [place] Vas score. Patient will benefit from anticoagulation. We will await the findings of the echocardiogram about possible valvular etiology.  continue aspirin at present.  3. lymph edema with decubitus ulcer. The patient is having significant lymphedema. Patient was found to be having decubitus ulcer as well as right thigh ulcer. Patient was also recommended to get an MRI as an outpatient to rule  out the possibility of sarcoma. At present I would check ESR and CRP and monitor for infection. Vascular Doppler ultrasound has been performed as an outpatient already. Wound care has been consulted. Patient may benefit from a Foley catheter to avoid worsening decubitus  ulcer healing. Social worker is already working on the patient.  4. immobility with hypoxia. Possibility of PE cannot be ruled out. I will get a CT PE protocol.  5. morbid obesity. Also possibility of pickwickian syndrome cannot be ruled out. ABG does not show any significant hypercarbia. We will continue to closely monitor. Bariatric bed  6. Diabetes. Placing her on sliding scale. Holding oral hypoglycemic agents.  Advance goals of care discussion:  Partial code. The patient does not want to be intubated but would like to get other resuscitative measures.  Consults: Wound care  DVT Prophylaxis: subcutaneous Heparin Nutrition: Cardiac and diabetic diet  Disposition: Admitted as inpatient, telemetry unit.  Author: Berle Mull, MD [facility] Pager: [phone] 07/24/2015  If 7PM-7AM, please contact night-coverage [email or web address] Password [email or web address]

## 2015-07-24 NOTE — Progress Notes (Addendum)
Patient ID: Amanda Gentry, female   DOB: [birth year], 69 y.o.   MRN: [id number]  [facility] PROGRESS NOTE  BIRDIA JAYCOX FOY:[id number] DOB: 19-Aug-1946 DOA: 07/23/2015 PCP: Cathlean Cower, MD   Brief narrative:    69 y.o. female with morbid obesity, history of sarcoma, lymphedema of the leg, hypertension, dyslipidemia, GERD, diabetes type 2, depression, hypothyroidism presented with dyspnea, LE edema, and non healing wounds.   Assessment/Plan:    Acute on chronic diastolic CHF (congestive heart failure) - Chest x-ray shows cardiomegaly with congestion and pt still with volume overload - continue lasix 40 mg IV QD, monitor closely due to soft BP - weight today is 166 kg, monitor daily weights, strict I/O - ECHO pending   HTN - hold Norvasc and ACEI for now - continue only lasix for the CHF noted above   Atrial fibrillation on admission EKG - reviewed by cardiologist, pt is in NSR - admission EKG reviewed and no evidence of a-fib  - continue to monitor on telemetry   Right buttock area cellulitis - placed on Vancomycin IV per pharmacy   Hyperkalemia - hold ACEI - repeat BMP in AM  Acute kidney injury  - monitor closely while on Lasix  - repeat BMP in AM  Lymphedema with decubitus ulcer. - still with significant lymphedema which is chronic in nature  - Patient was found to have decubitus ulcer as well as right thigh ulcer. - wound care team consulted for assistance   Morbid obesity - Body mass index is 67.11 kg/(m^2).  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   IV access:  Peripheral IV  Procedures and diagnostic studies:    Dg Chest 2 View 07/23/2015  Cardiomegaly with probable early alveolar edema superimposed on interstitial edema  although other alveolar filling processes could appear similar.    Ct Angio Chest Pe W/cm &/or Wo Cm 07/24/2015  No evidence of significant pulmonary embolus. 2. Trace bilateral pleural effusions noted. Patchy bilateral central airspace opacities, with interstitial prominence and vague ground-glass airspace opacities bilaterally, likely reflecting pulmonary edema. 3. Mild cardiomegaly. 4. 1.6 cm periaortic node, of uncertain significance. 5. Small amount of ascites noted about the liver and stomach. 6. Degenerative change at the left glenohumeral joint.   Medical Consultants:  Wound care   Other Consultants:  None  IAnti-Infectives:   None  Faye Ramsay, MD  [facility] Pager [phone]  If 7PM-7AM, please contact night-coverage [email or web address] Password [email or web address] 07/24/2015, 6:54 AM   LOS: 1 day   HPI/Subjective: No events overnight.   Objective: Filed Vitals:   07/23/15 2132 07/23/15 2200 07/24/15 0129 07/24/15 0532  BP: 111/58 108/50 124/51 94/45  Pulse:  74 86 76  Temp:   97.9 F (36.6 C) 97.7 F (36.5 C)  TempSrc:   Oral Oral  Resp: 20 19 20 16   Height:   5\' 2"  (1.575 m)   Weight:   166.47 kg (367 lb)   SpO2: 95% 95% 94% 95%    Intake/Output Summary (Last 24 hours) at 07/24/15 0654 Last data filed at 07/24/15 0513  Gross per 24 hour  Intake      0 ml  Output    400 ml  Net   -400 ml    Exam:   General:  Pt is alert, follows commands appropriately, morbid obesity   Cardiovascular: Regular rate and rhythm, no rubs, no gallops  Respiratory: Clear to auscultation bilaterally, no wheezing, no crackles, no rhonchi  Abdomen: Soft, non tender, non distended, bowel sounds present, no guarding  Extremities: bilateral venous stasis changes, +2 bilateral LE edema   Data Reviewed: Basic Metabolic Panel:  Recent Labs Lab 07/23/15 1801  NA 141  K 5.0  CL 113*  CO2 23  GLUCOSE 109*  BUN 33*  CREATININE 1.02*  CALCIUM 8.8*   Liver Function Tests:  Recent  Labs Lab 07/24/15 0235  AST 19  ALT 9*  ALKPHOS 61  BILITOT 0.5  PROT 6.7  ALBUMIN 3.2*   CBC:  Recent Labs Lab 07/23/15 1801  WBC 7.2  NEUTROABS 6.2  HGB 11.5*  HCT 37.7  MCV 90.0  PLT 163   CBG:  Recent Labs Lab 07/23/15 1645 07/23/15 2358  GLUCAP 101* 125*   Scheduled Meds: . amLODipine  10 mg Oral Daily  . aspirin EC  81 mg Oral Daily  . benazepril  40 mg Oral Daily  . enoxaparin (LOVENOX) injection  40 mg Subcutaneous QHS  . escitalopram  10 mg Oral Daily  . [START ON 07/25/2015] Influenza vac split quadrivalent PF  0.5 mL Intramuscular Tomorrow-1000  . insulin aspart  0-15 Units Subcutaneous TID WC  . insulin aspart  0-5 Units Subcutaneous QHS  . levothyroxine  100 mcg Oral QAC breakfast  . pantoprazole  40 mg Oral Daily  . rosuvastatin  20 mg Oral q1800  . sodium chloride  3 mL Intravenous Q12H   Continuous Infusions:

## 2015-07-24 NOTE — Progress Notes (Signed)
Admission telemetry and EKG reviewed with Dr. Mare Ferrari, patient has been in normal sinus rhythm with occasional PACs. EKG interpreted wrong probably because the p wave is not very obvious. Case discussed with Dr. Doyle Askew. Cardiology consult cancelled.   Hilbert Corrigan PA Pager: [phone]

## 2015-07-24 NOTE — Progress Notes (Signed)
Patient blood pressure in the low 90s and no urine output since 1400, bladder scan shows 0cc. Dr Doyle Askew notified and stated to continue to assess patient.

## 2015-07-24 NOTE — ED Notes (Signed)
Patient transported to CT 

## 2015-07-25 ENCOUNTER — Observation Stay (HOSPITAL_COMMUNITY): Payer: Medicare Other

## 2015-07-25 DIAGNOSIS — I5033 Acute on chronic diastolic (congestive) heart failure: Secondary | ICD-10-CM

## 2015-07-25 LAB — GLUCOSE, CAPILLARY
GLUCOSE-CAPILLARY: 119 mg/dL — AB (ref 65–99)
GLUCOSE-CAPILLARY: 122 mg/dL — AB (ref 65–99)
Glucose-Capillary: 109 mg/dL — ABNORMAL HIGH (ref 65–99)

## 2015-07-25 LAB — BASIC METABOLIC PANEL
ANION GAP: 5 (ref 5–15)
Anion gap: 6 (ref 5–15)
Anion gap: 6 (ref 5–15)
BUN: 35 mg/dL — ABNORMAL HIGH (ref 6–20)
BUN: 36 mg/dL — ABNORMAL HIGH (ref 6–20)
BUN: 39 mg/dL — AB (ref 6–20)
CALCIUM: 8.7 mg/dL — AB (ref 8.9–10.3)
CALCIUM: 8.7 mg/dL — AB (ref 8.9–10.3)
CHLORIDE: 109 mmol/L (ref 101–111)
CO2: 24 mmol/L (ref 22–32)
CO2: 24 mmol/L (ref 22–32)
CO2: 25 mmol/L (ref 22–32)
CREATININE: 1.95 mg/dL — AB (ref 0.44–1.00)
CREATININE: 2.42 mg/dL — AB (ref 0.44–1.00)
Calcium: 8.3 mg/dL — ABNORMAL LOW (ref 8.9–10.3)
Chloride: 106 mmol/L (ref 101–111)
Chloride: 106 mmol/L (ref 101–111)
Creatinine, Ser: 1.92 mg/dL — ABNORMAL HIGH (ref 0.44–1.00)
GFR calc Af Amer: 22 mL/min — ABNORMAL LOW (ref >60)
GFR, EST AFRICAN AMERICAN: 30 mL/min — AB (ref >60)
GFR, EST AFRICAN AMERICAN: 29 mL/min — AB (ref >60)
GFR, EST NON AFRICAN AMERICAN: 26 mL/min — AB (ref >60)
GFR, EST NON AFRICAN AMERICAN: 25 mL/min — AB (ref >60)
GFR, EST NON AFRICAN AMERICAN: 19 mL/min — AB (ref >60)
GLUCOSE: 130 mg/dL — AB (ref 65–99)
GLUCOSE: 131 mg/dL — AB (ref 65–99)
Glucose, Bld: 136 mg/dL — ABNORMAL HIGH (ref 65–99)
POTASSIUM: 6.1 mmol/L — AB (ref 3.5–5.1)
Potassium: 5.9 mmol/L — ABNORMAL HIGH (ref 3.5–5.1)
Potassium: 6 mmol/L — ABNORMAL HIGH (ref 3.5–5.1)
SODIUM: 139 mmol/L (ref 135–145)
SODIUM: 137 mmol/L (ref 135–145)
Sodium: 135 mmol/L (ref 135–145)

## 2015-07-25 LAB — BLOOD GAS, ARTERIAL
Acid-base deficit: 7.8 mmol/L — ABNORMAL HIGH (ref 0.0–2.0)
Acid-base deficit: 7.8 mmol/L — ABNORMAL HIGH (ref 0.0–2.0)
BICARBONATE: 21.6 meq/L (ref 20.0–24.0)
Bicarbonate: 21.3 mEq/L (ref 20.0–24.0)
DELIVERY SYSTEMS: POSITIVE
DRAWN BY: 11249
Drawn by: 270211
Expiratory PAP: 6
FIO2: 0.4
INSPIRATORY PAP: 14
Mode: POSITIVE
O2 Content: 3.5 L/min
O2 Saturation: 93.2 %
O2 Saturation: 94.3 %
PH ART: 7.141 — AB (ref 7.350–7.450)
PO2 ART: 72.8 mmHg — AB (ref 80.0–100.0)
PO2 ART: 73.7 mmHg — AB (ref 80.0–100.0)
Patient temperature: 98.6
Patient temperature: 98.4
RATE: 8 resp/min
TCO2: 21 mmol/L (ref 0–100)
TCO2: 20.7 mmol/L (ref 0–100)
pCO2 arterial: 66 mmHg (ref 35.0–45.0)
pCO2 arterial: 62.8 mmHg (ref 35.0–45.0)
pH, Arterial: 7.156 — CL (ref 7.350–7.450)

## 2015-07-25 LAB — MRSA PCR SCREENING: MRSA BY PCR: NEGATIVE

## 2015-07-25 LAB — CBC
HEMATOCRIT: 39.1 % (ref 36.0–46.0)
Hemoglobin: 11.2 g/dL — ABNORMAL LOW (ref 12.0–15.0)
MCH: 27.7 pg (ref 26.0–34.0)
MCHC: 28.6 g/dL — ABNORMAL LOW (ref 30.0–36.0)
MCV: 96.8 fL (ref 78.0–100.0)
PLATELETS: 174 10*3/uL (ref 150–400)
RBC: 4.04 MIL/uL (ref 3.87–5.11)
RDW: 18.5 % — ABNORMAL HIGH (ref 11.5–15.5)
WBC: 9.2 10*3/uL (ref 4.0–10.5)

## 2015-07-25 LAB — HEMOGLOBIN A1C
Hgb A1c MFr Bld: 6.5 % — ABNORMAL HIGH (ref 4.8–5.6)
MEAN PLASMA GLUCOSE: 140 mg/dL

## 2015-07-25 MED ORDER — VANCOMYCIN HCL 10 G IV SOLR
1750.0000 mg | INTRAVENOUS | Status: DC
  Filled 2015-07-25: qty 1750

## 2015-07-25 MED ORDER — SODIUM POLYSTYRENE SULFONATE 15 GM/60ML PO SUSP: 30.0000 g | Freq: Once | ORAL | Status: DC

## 2015-07-25 MED ORDER — FUROSEMIDE 10 MG/ML IJ SOLN
80.0000 mg | Freq: Once | INTRAMUSCULAR | Status: AC
  Administered 2015-07-25: 80 mg via INTRAVENOUS

## 2015-07-25 MED ORDER — MIDAZOLAM HCL 5 MG/ML IJ SOLN: 4.0000 mg | Freq: Once | INTRAMUSCULAR | Status: DC

## 2015-07-25 MED ORDER — HEPARIN SODIUM (PORCINE) 5000 UNIT/ML IJ SOLN
5000.0000 [IU] | Freq: Three times a day (TID) | INTRAMUSCULAR | Status: DC
  Administered 2015-07-25 – 2015-07-28 (×8): 5000 [IU] via SUBCUTANEOUS
  Filled 2015-07-25 (×7): qty 1

## 2015-07-25 MED ORDER — DEXTROSE 5 % IV SOLN
500.0000 mg | Freq: Once | INTRAVENOUS | Status: AC
  Administered 2015-07-25: 500 mg via INTRAVENOUS
  Filled 2015-07-25: qty 500

## 2015-07-25 MED ORDER — CLINDAMYCIN PHOSPHATE 600 MG/50ML IV SOLN
600.0000 mg | Freq: Three times a day (TID) | INTRAVENOUS | Status: DC
  Administered 2015-07-25: 600 mg via INTRAVENOUS
  Filled 2015-07-25: qty 50

## 2015-07-25 MED ORDER — DEXTROSE 5 % IV SOLN
120.0000 mg | Freq: Once | INTRAVENOUS | Status: AC
  Administered 2015-07-26: 120 mg via INTRAVENOUS
  Filled 2015-07-25: qty 12

## 2015-07-25 MED ORDER — MIDAZOLAM HCL 2 MG/2ML IJ SOLN: 4.0000 mg | Freq: Once | INTRAMUSCULAR | Status: DC

## 2015-07-25 MED ORDER — FUROSEMIDE 10 MG/ML IJ SOLN
INTRAMUSCULAR | Status: AC
  Administered 2015-07-25: 80 mg via INTRAVENOUS
  Filled 2015-07-25: qty 8

## 2015-07-25 MED ORDER — DEXTROSE 5 % IV SOLN
0.0000 ug/min | INTRAVENOUS | Status: DC
  Filled 2015-07-25: qty 4

## 2015-07-25 MED ORDER — SODIUM CHLORIDE 0.9 % IV SOLN
INTRAVENOUS | Status: DC
  Administered 2015-07-25: 11:00:00 via INTRAVENOUS

## 2015-07-25 MED ORDER — DEXTROSE 5 % IV SOLN
2.0000 g | Freq: Two times a day (BID) | INTRAVENOUS | Status: DC
  Administered 2015-07-25 – 2015-07-27 (×4): 2 g via INTRAVENOUS
  Filled 2015-07-25 (×5): qty 2

## 2015-07-25 MED ORDER — ROCURONIUM BROMIDE 50 MG/5ML IV SOLN
100.0000 mg | Freq: Once | INTRAVENOUS | Status: DC
  Filled 2015-07-25: qty 10

## 2015-07-25 MED ORDER — SODIUM CHLORIDE 0.9 % IV SOLN: INTRAVENOUS | Status: DC

## 2015-07-25 MED ORDER — SODIUM POLYSTYRENE SULFONATE 15 GM/60ML PO SUSP
60.0000 g | Freq: Once | ORAL | Status: DC
  Filled 2015-07-25 (×2): qty 240

## 2015-07-25 MED ORDER — ETOMIDATE 2 MG/ML IV SOLN
0.3000 mg/kg | Freq: Once | INTRAVENOUS | Status: DC
  Filled 2015-07-25: qty 26.75

## 2015-07-25 NOTE — Progress Notes (Signed)
ABG results were abnormal. MD paged. Will transfer pt to stepdown unit when bed available. Will continue to monitor. Callie Fielding RN

## 2015-07-25 NOTE — Progress Notes (Signed)
Rapid Response Event Note  Overview: Lethargy, hematuria  Initial Focused Assessment: 1630 Called to patient's bedside to evaluate patient for lethargy and hematuria, low UOP.  Upon assessment, patient drowsy, easily awakens to voice, is oriented x4, follows all commands.  Answers all questions appropriately.  Patient HR and BP WNL.  O2 sats 93-94% on 3L nasal cannula.  Patient denies SOB, no labored breathing noted, however, nailbed cyanosis observed.  Per patient and family member, patient has never needed to wear CPAP or BiPAP at home, or during previous hospitalizations.  Also, per discussion with patient's RN and charge RN on 4W, patient has had markedly decreased UOP >12 hours.  Scant amount of bloody urine in Foley bag.  Bladder scan yielded no urine.  Foley has been irrigated with no urine return.  Per patient, she has never had any problems with voiding.  Has been on Lasix IV since admission for fluid overload.  Small bump in creatinine overnight.    Interventions: MD notified of patient's lethargy, ABG and CXR ordered.  MD requested to transfer patient to stepdown unit for closer monitoring.  ABG resulted with pH 7.1 and CO2 66.  Patient to be transferred to 1236 ASAP.  MD to consult ELink.  Event Summary:   at      at          Karren Burly

## 2015-07-25 NOTE — Consult Note (Signed)
PULMONARY / CRITICAL CARE MEDICINE   Name: Amanda Gentry MRN: [id number] DOB: 1946-09-07    ADMISSION DATE:  07/23/2015 CONSULTATION DATE:  07/25/2015  REFERRING MD :  Dr. Doyle Askew  CHIEF COMPLAINT:  Acute hypercapneic respiratory failure  INITIAL PRESENTATION:  Admitted with SOB and non-healing wounds.  STUDIES:  TTE 07/24/2015: Study Conclusions  - Left ventricle: The cavity size was normal. Systolic function was normal. The estimated ejection fraction was in the range of 50% to 55%. Wall motion was normal; there were no regional wall motion abnormalities. - Aortic valve: There was mild regurgitation. - Mitral valve: Calcified annulus. - Left atrium: The atrium was mildly dilated. - Pulmonary arteries: Systolic pressure was mildly to moderately increased. PA peak pressure: 46 mm Hg (S).  SIGNIFICANT EVENTS: Acute decompensation with respiratory acidosis and CHF.   HISTORY OF PRESENT ILLNESS:   69 yo female with h/o DM, morbid obesity and bedbound, chronic non-healing decubiti who presented with SOB and worsening of her volume overload due to furosemide noncompliance.  She developed AKI while in the hospital and progressive respiratory failure requiring BiPAP.  PCCM is consulted for assistance with decompensated CHF and hypercapneic respiratory failure.  PAST MEDICAL HISTORY :   has a past medical history of ANXIETY (09/23/2007); AORTIC INSUFFICIENCY (09/23/2007); DEPRESSION (09/23/2007); DIABETES MELLITUS, TYPE II (09/23/2007); GERD (09/23/2007); HYPERLIPIDEMIA (09/23/2007); HYPERTENSION (09/23/2007); HYPERTHYROIDISM (09/23/2007); INSOMNIA (09/23/2007); KNEE PAIN, RIGHT, CHRONIC (03/14/2008); LOW BACK PAIN (09/23/2007); Morbid obesity (09/23/2007); OSTEOARTHRITIS (09/23/2007); PERIPHERAL EDEMA (09/23/2007); VITAMIN D DEFICIENCY (03/31/2010); Degenerative arthritis of right knee (01/28/2012); and Acquired lymphedema of leg (01/29/2015).  has past surgical history that includes Abdominal  hysterectomy; Cholecystectomy; and Oophorectomy. Prior to Admission medications   Medication Sig Start Date End Date Taking? Authorizing Provider  acetaminophen (TYLENOL) 500 MG tablet Take 1,000 mg by mouth every 6 (six) hours as needed for moderate pain.   Yes Historical Provider, MD  ALPRAZolam Duanne Moron) 0.5 MG tablet take 1 tablet by mouth once daily if needed for anxiety 02/18/15  Yes Biagio Borg, MD  amLODipine (NORVASC) 10 MG tablet Take 1 tablet (10 mg total) by mouth daily. 08/28/14  Yes Biagio Borg, MD  aspirin 81 MG tablet Take 81 mg by mouth daily.     Yes Historical Provider, MD  benazepril (LOTENSIN) 40 MG tablet Take 1 tablet (40 mg total) by mouth daily. 08/28/14  Yes Biagio Borg, MD  diclofenac (VOLTAREN) 75 MG EC tablet take 1 tablet by mouth twice a day if needed Patient taking differently: take 1 tablet by mouth twice a day if needed pain 01/07/15  Yes Biagio Borg, MD  escitalopram (LEXAPRO) 10 MG tablet Take 1 tablet (10 mg total) by mouth daily. 01/29/15 07/23/15 Yes Biagio Borg, MD  levothyroxine (SYNTHROID, LEVOTHROID) 100 MCG tablet Take 1 tablet (100 mcg total) by mouth daily. 08/28/14  Yes Biagio Borg, MD  metFORMIN (GLUCOPHAGE) 500 MG tablet Take 1 tablet (500 mg total) by mouth 2 (two) times daily with a meal. Patient taking differently: Take 500 mg by mouth daily.  08/28/14  Yes Biagio Borg, MD  Multiple Vitamins-Minerals (CENTRUM SILVER PO) Take 1 tablet by mouth daily.   Yes Historical Provider, MD  omeprazole (PRILOSEC) 20 MG capsule Take 1 capsule (20 mg total) by mouth daily. 10/23/13  Yes Biagio Borg, MD  pioglitazone (ACTOS) 45 MG tablet Take 1 tablet (45 mg total) by mouth daily. 08/28/14  Yes Biagio Borg, MD  rosuvastatin (CRESTOR) 20 MG  tablet Take 1 tablet (20 mg total) by mouth daily. 08/28/14  Yes Biagio Borg, MD  traMADol Veatrice Bourbon) 50 MG tablet take 1 tablet by mouth four times a day if needed Patient taking differently: take 1 tablet by mouth four times a  day if needed pain 07/15/15  Yes Biagio Borg, MD  hydrochlorothiazide (HYDRODIURIL) 25 MG tablet Take 1 tablet (25 mg total) by mouth daily. Patient not taking: Reported on 07/23/2015 08/28/14   Biagio Borg, MD  levofloxacin (LEVAQUIN) 250 MG tablet Take 1 tablet (250 mg total) by mouth daily. Patient not taking: Reported on 07/23/2015 08/28/14   Biagio Borg, MD  predniSONE (DELTASONE) 10 MG tablet 3 tabs by mouth per day for 3 days,2tabs per day for 3 days,1tab per day for 3 days Patient not taking: Reported on 07/23/2015 08/28/14   Biagio Borg, MD   Allergies  Allergen Reactions  . Lipitor [Atorvastatin]     Generic capsule causes bad taste in mouth    FAMILY HISTORY:  has no family status information on file.  SOCIAL HISTORY:  reports that she has never smoked. She does not have any smokeless tobacco history on file. She reports that she does not drink alcohol or use illicit drugs.  REVIEW OF SYSTEMS:   14point ROS was performed and negative other than stated above.   VITAL SIGNS: Temp:  [97.5 F (36.4 C)-98.4 F (36.9 C)] 98.4 F (36.9 C) (09/02 2000) Pulse Rate:  [58-75] 58 (09/02 2000) Resp:  [13-20] 20 (09/02 2000) BP: (101-121)/(40-83) 108/57 mmHg (09/02 2000) SpO2:  [90 %-96 %] 95 % (09/02 2000) Weight:  [173.274 kg (382 lb)-178.264 kg (393 lb)] 178.264 kg (393 lb) (09/02 1848) HEMODYNAMICS:   VENTILATOR SETTINGS:   INTAKE / OUTPUT:  Intake/Output Summary (Last 24 hours) at 07/25/15 2213 Last data filed at 07/25/15 1644  Gross per 24 hour  Intake 1207.5 ml  Output    210 ml  Net  997.5 ml    PHYSICAL EXAMINATION: General:  Obese, mild respiratory distress Neuro:  CN II-XII grossly intact HEENT:  NCAT Cardiovascular:   + holosystolic murmur across precordium +Anasarca Lungs:  Bibasilar R>L rales Abdomen:  Obese, no ascites Skin:  Multiple skin tears with decubiti and weeping edema LE: b/l 3+ pitting edema.  LABS:  CBC  Recent Labs Lab 07/23/15 1801  07/24/15 0825 07/25/15 0518  WBC 7.2 6.4 9.2  HGB 11.5* 11.2* 11.2*  HCT 37.7 37.3 39.1  PLT 163 165 174   Coag's No results for input(s): APTT, INR in the last 168 hours. BMET  Recent Labs Lab 07/25/15 0658 07/25/15 0930 07/25/15 1755  NA 135 139 137  K 6.1* 5.9* 6.0*  CL 106 109 106  CO2 24 24 25   BUN 35* 36* 39*  CREATININE 1.92* 1.95* 2.42*  GLUCOSE 130* 136* 131*   Electrolytes  Recent Labs Lab 07/24/15 0825 07/25/15 0658 07/25/15 0930 07/25/15 1755  CALCIUM 8.7* 8.3* 8.7* 8.7*  MG 2.2  --   --   --    Sepsis Markers No results for input(s): LATICACIDVEN, PROCALCITON, O2SATVEN in the last 168 hours. ABG  Recent Labs Lab 07/23/15 2344 07/25/15 1732 07/25/15 2100  PHART 7.347* 7.141* 7.156*  PCO2ART 41.2 66.0* 62.8*  PO2ART 73.0* 72.8* 73.7*   Liver Enzymes  Recent Labs Lab 07/24/15 0235 07/24/15 0825  AST 19 21  ALT 9* 11*  ALKPHOS 61 63  BILITOT 0.5 0.4  ALBUMIN 3.2* 3.3*   Cardiac  Enzymes No results for input(s): TROPONINI, PROBNP in the last 168 hours. Glucose  Recent Labs Lab 07/24/15 1149 07/24/15 1638 07/24/15 2053 07/25/15 0725 07/25/15 1158 07/25/15 1655  GLUCAP 120* 114* 140* 109* 119* 122*    Imaging US Renal  07/25/2015   CLINICAL DATA:  Elevated creatinine, history diabetes mellitus, hypertension  EXAM: RENAL / URINARY TRACT ULTRASOUND COMPLETE  COMPARISON:  None.  FINDINGS: Right Kidney:  Length: 10.0 cm. Normal cortical thickness and echogenicity. No mass, hydronephrosis or shadowing calcification.  Left Kidney:  Length: 9.7 cm. Suboptimal visualization of LEFT kidney secondary to body habitus. Cortical thinning identified. No gross evidence of mass or hydronephrosis identified on limited assessment.  Bladder:  Decompressed by Foley catheter, not evaluated.  IMPRESSION:  Poor visualization of LEFT kidney secondary to body habitus, with no gross mass or hydronephrosis identified.  Unremarkable RIGHT kidney.   Electronically  Signed   By: Lavonia Dana M.D.   On: 07/25/2015 16:16   Dg Chest Port 1 View  07/25/2015   CLINICAL DATA:  Lethargy, decreased responsiveness  EXAM: PORTABLE CHEST - 1 VIEW  COMPARISON:  CT 07/24/2015 and chest radiograph 07/23/2015  FINDINGS: Patchy hazy perihilar airspace opacities are slightly increased since previously. Cardiomegaly reidentified. Lungs are hypoaerated with crowding of the bronchovascular markings. Trace pleural effusions.  IMPRESSION: Increased patchy pulmonary parenchymal airspace opacities which are nonspecific but could indicate worsening edema in the setting of cardiomegaly. Infection or ARDS could appear similar.  Trace pleural effusions.   Electronically Signed   By: Conchita Paris M.D.   On: 07/25/2015 17:53     ASSESSMENT / PLAN:  69 yo female with MODS.  She has decompensated CHF with pulmonary edema and resulted hypoventilation resulting in acute hypercapneic/hypoxemic respiratory failure and respiratory acidosis.  She has an AKI that is likely contrast induced nephropathy vs cardio-renal syndrome.  PULMONARY  A: Acute hypercapneic/hypoxemic respiratory failure from pulmonary edema that is likely cardiogenic but ARDS is plausible as well.  Her hypercapnia/hypoventilation is likely due to pulmonary edema. P:    - change BiPap to AVAPs  - furosemide 80mg  IV now, if no UOP in 1hour.  Will repeat furosemide with diuril given 64min before.  - family is ambiguous on intubation status.  Given her pH and PCO2 she meets intubation criteria now, but is  still alert, arousable and protecting her airway.  Will try diuresis  CARDIOVASCULAR A: Acute on chronic decompensated HFpEF, likely due to volume overload from AKI and anuric ARF. P:   - Aggressive diuresis as above.  - her EF is normal so inotropes not indicated  RENAL A:  Acute anuric renal failure (basline Scr 1.0), suspect contrast induced nephropathy given recent CTPE study, cardio-renal is plasible but MAPs are  Adequate at this time. P:    - Diuresis as above  - Hyperkalemia treatment with Kayex  - EKG  - Enoxaparin d/ced given anuric ARF.  - dialysis is impending but patient not sure if she wants this therapy  INFECTIOUS A:  Sepsis is plausible but less likely at this time. P:   BCx2 07/25/2015 Abx: Vanc/ceftaz  DVT PPx:  - SQH TID  Goals of care: After extensive discussion with the patient and her family.  The patient (being fully alert and oriented) states she does not wish to escalate care at this time.  She would like to continue non-invasive forms of support including oral medications and IV diuretics.  She does not want CPR, she does not want  defibrillation and does not want intubation and does not want hemodialysis or other forms of artificial life support.  If her condition worsens and she is deteriorating in level of comfort, she wishes to shift to comfort care only.  FAMILY  - Updates: family updated today.   Total critical care time: 120 min  Critical care time was exclusive of separately billable procedures and treating other patients.  Critical care was necessary to treat or prevent imminent or life-threatening deterioration.  Critical care was time spent personally by me on the following activities: development of treatment plan with patient and/or surrogate as well as nursing, discussions with consultants, evaluation of patient's response to treatment, examination of patient, obtaining history from patient or surrogate, ordering and performing treatments and interventions, ordering and review of laboratory studies, ordering and review of radiographic studies, pulse oximetry and re-evaluation of patient's condition.   Meribeth Mattes, DO., MS [facility]    Pulmonary and [place] Pager: [phone]  07/25/2015, 10:13 PM

## 2015-07-25 NOTE — Progress Notes (Signed)
Pt was informed that her status was changed yesterday 9/1 to observation from IP. Pt was not in room to recieve this information on yesterday 9/1.

## 2015-07-25 NOTE — Progress Notes (Signed)
PT Cancellation Note  Patient Details Name: Amanda Gentry MRN: [id number] DOB: 08-25-1946   Cancelled Treatment:    Reason Eval/Treat Not Completed: Attempted PT eval-pt declined to participate on today. Pt requested PT check back another day.    Weston Anna, MPT Pager: [phone]

## 2015-07-25 NOTE — Progress Notes (Signed)
ANTIBIOTIC CONSULT NOTE - INITIAL  Pharmacy Consult for vancomycin, ceftazidime Indication: HCAP  Allergies  Allergen Reactions  . Lipitor [Atorvastatin]     Generic capsule causes bad taste in mouth    Patient Measurements: Height: 5\' 2"  (157.5 cm) Weight: (!) 393 lb (178.264 kg) IBW/kg (Calculated) : 50.1 Adjusted Body Weight:   Vital Signs: Temp: 97.6 F (36.4 C) (09/02 1848) Temp Source: Oral (09/02 1848) BP: 115/83 mmHg (09/02 1403) Pulse Rate: 68 (09/02 1848) Intake/Output from previous day: 09/01 0701 - 09/02 0700 In: 1200 [P.O.:1050] Out: 410 [Urine:410] Intake/Output from this shift: Total I/O In: 727.5 [P.O.:230; I.V.:447.5; IV Piggyback:50] Out: -   Labs:  Recent Labs  07/23/15 1801 07/24/15 0825 07/25/15 0518 07/25/15 0658 07/25/15 0930  WBC 7.2 6.4 9.2  --   --   HGB 11.5* 11.2* 11.2*  --   --   PLT 163 165 174  --   --   CREATININE 1.02* 1.01*  --  1.92* 1.95*   Estimated Creatinine Clearance: 43.6 mL/min (by C-G formula based on Cr of 1.95). No results for input(s): VANCOTROUGH, VANCOPEAK, VANCORANDOM, GENTTROUGH, GENTPEAK, GENTRANDOM, TOBRATROUGH, TOBRAPEAK, TOBRARND, AMIKACINPEAK, AMIKACINTROU, AMIKACIN in the last 72 hours.   Microbiology: Recent Results (from the past 720 hour(s))  Culture, blood (routine x 2)     Status: None (Preliminary result)   Collection Time: 07/24/15  2:35 AM  Result Value Ref Range Status   Specimen Description BLOOD LEFT ARM  Final   Special Requests BOTTLES DRAWN AEROBIC AND ANAEROBIC Bertie  Final   Culture   Final    NO GROWTH 1 DAY Performed at [facility]    Report Status PENDING  Incomplete  Culture, blood (routine x 2)     Status: None (Preliminary result)   Collection Time: 07/24/15  2:38 AM  Result Value Ref Range Status   Specimen Description BLOOD RIGHT HAND  Final   Special Requests BOTTLES DRAWN AEROBIC ONLY [place]  Final   Culture   Final    NO GROWTH 1 DAY Performed at [facility]    Report Status PENDING  Incomplete    Medical History: Past Medical History  Diagnosis Date  . ANXIETY 09/23/2007  . AORTIC INSUFFICIENCY 09/23/2007  . DEPRESSION 09/23/2007  . DIABETES MELLITUS, TYPE II 09/23/2007  . GERD 09/23/2007  . HYPERLIPIDEMIA 09/23/2007  . HYPERTENSION 09/23/2007  . HYPERTHYROIDISM 09/23/2007  . INSOMNIA 09/23/2007  . KNEE PAIN, RIGHT, CHRONIC 03/14/2008  . LOW BACK PAIN 09/23/2007  . Morbid obesity 09/23/2007  . OSTEOARTHRITIS 09/23/2007  . PERIPHERAL EDEMA 09/23/2007  . VITAMIN D DEFICIENCY 03/31/2010  . Degenerative arthritis of right knee 01/28/2012  . Acquired lymphedema of leg 01/29/2015   Assessment: 71 YOF presents with LE edema and non-healing wound/decubitus ulcer. Pharmacy asked to dose vancomycin on 9/1 for cellulitis but was later changed to clinda now changing back to vanc and adding ceftaz for HCAP per pharmacy dosing 9/2  9/1 >>vancomycin  >> 9/2 >> clinda >> 9/2 9/2 >> ceftaz >>  9/1 blood: pending  SCr sharp rise up to 1.95 with est CrCl N 31 and CG 43 WBC WNL afebrile  Dose changes/levels:  Goal of Therapy:  Vanc Trough 15-20 mcg/mL  Plan:  1) Patient received vanc 2g x 1 yesterday. Per current renal function, start Vancomycin 1750mg  IV q24 2) Start ceftazidime 2g IV q12 per CrCl 30-50 ml/min   Adrian Saran, PharmD, BCPS Pager [phone] 07/25/2015 6:53 PM

## 2015-07-25 NOTE — Progress Notes (Signed)
Spoke with [facility], NP and CCM, MD concerning pt status, new orders received and initiated from [facility], NP, CCM to evaluate pt.

## 2015-07-25 NOTE — Progress Notes (Signed)
Critical value called from lab. K 6.1. Creatinine also increased from 1.01 to 1.92. Still not much urine output. MD made aware. Came to see pt and will enter orders as necessary. Will continue to monitor.  Fritz Pickerel, RN

## 2015-07-25 NOTE — Progress Notes (Addendum)
Patient ID: Amanda Gentry, female   DOB: 1946/04/14, 69 y.o.   MRN: [id number]  [facility] PROGRESS NOTE  Amanda Gentry EHM:[id number] DOB: 05/23/1946 DOA: 07/23/2015 PCP: Cathlean Cower, MD   Brief narrative:    69 y.o. female with morbid obesity, history of sarcoma, lymphedema of the leg, hypertension, dyslipidemia, GERD, diabetes type 2, depression, hypothyroidism presented with dyspnea, LE edema, and non healing wounds.   Major events since admission: 9/2 - more somnolent, Cr up to 1.9, transfer to SDU  Assessment/Plan:    Acute hypoxic respiratory failure - appears to be secondary to PNA, acute on chronic diastolic CHF - Lasix was given initially but Cr continued to trend up - lasix stopped 9/3 - placed on broad spectrum ABX for PNA as discussed with PCCM  Acute on chronic diastolic CHF (congestive heart failure) - Chest x-ray shows cardiomegaly with congestion and pt still with volume overload - started lasix 40 mg IV but Cr continued trending up - weight on admission 166 kg now up to 178 kg, monitor daily weights, strict I/O - may be difficult to get accurate weight due to morbid obesity  - ECHO pending   Acute renal failure - unclear etiology, was on ACEI at home, ? ATN, contrast induced (pt had CT chest angio) - renal US requested - pt has gotten one dose of Vancomycin 9/2 - repeat again in Am and ? Nephrology consult   Hematuria - unclear etiology - follow upon UA and renal US  Hyperkalemia - one dose of kayexalate - hold ACEI - repeat tonight   HTN - hold Norvasc and ACEI for now - difficult to give lasix at this point   Atrial fibrillation on admission EKG - reviewed by cardiologist, pt is in NSR - admission EKG reviewed and no evidence of a-fib   Right buttock area cellulitis - placed on Vancomycin IV per pharmacy  - added fortaz as noted above   Lymphedema with decubitus ulcer. - still with significant lymphedema which is chronic in nature   - Patient was found to have decubitus ulcer as well as right thigh ulcer. - wound care team consulted for assistance   Morbid obesity - Body mass index is 69.85 kg/(m^2).  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   IV access:  Peripheral IV  Procedures and diagnostic studies:    Dg Chest 2 View 07/23/2015  Cardiomegaly with probable early alveolar edema superimposed on interstitial edema although other alveolar filling processes could appear similar.    Ct Angio Chest Pe W/cm &/or Wo Cm 07/24/2015  No evidence of significant pulmonary embolus. 2. Trace bilateral pleural effusions noted. Patchy bilateral central airspace opacities, with interstitial prominence and vague ground-glass airspace opacities bilaterally, likely reflecting pulmonary edema. 3. Mild cardiomegaly. 4. 1.6 cm periaortic node, of uncertain significance. 5. Small amount of ascites noted about the liver and stomach. 6. Degenerative change at the left glenohumeral joint.   Medical Consultants:  Wound care   Other Consultants:  None  IAnti-Infectives:   None  Faye Ramsay, MD  [facility] Pager [phone]  If 7PM-7AM, please contact night-coverage [email or web address] Password [facility] 07/25/2015, 6:29 PM   LOS: 2 days   HPI/Subjective: No events overnight. More somnolent.   Objective: Filed Vitals:  07/24/15 1805 07/24/15 2056 07/25/15 0421 07/25/15 1403  BP: 90/44 100/44 101/69 115/83  Pulse: 69 69 66 75  Temp:  97.5 F (36.4 C) 97.5 F (36.4 C) 97.5 F (36.4 C)  TempSrc:  Oral Oral Oral  Resp:  16 14 15   Height:      Weight:   173.274 kg (382 lb)   SpO2:  95% 90% 96%    Intake/Output Summary (Last 24 hours) at 07/25/15 1829 Last data filed at 07/25/15 1644  Gross per 24 hour   Intake 1207.5 ml  Output    210 ml  Net  997.5 ml    Exam:   General:  Pt is somnolent but easy to arouse   Cardiovascular: Regular rate and rhythm, no rubs, no gallops  Respiratory: Distant hear sounds, diminished air movement at bases   Abdomen: Soft, non tender, non distended, bowel sounds present, no guarding  Extremities: bilateral venous stasis changes, +2 bilateral LE edema, right buttock area with erythema and drainage from open area, look better this AM   Data Reviewed: Basic Metabolic Panel:  Recent Labs Lab 07/23/15 1801 07/24/15 0825 07/25/15 0658 07/25/15 0930  NA 141 139 135 139  K 5.0 5.5* 6.1* 5.9*  CL 113* 112* 106 109  CO2 23 24 24 24   GLUCOSE 109* 117* 130* 136*  BUN 33* 33* 35* 36*  CREATININE 1.02* 1.01* 1.92* 1.95*  CALCIUM 8.8* 8.7* 8.3* 8.7*  MG  --  2.2  --   --    Liver Function Tests:  Recent Labs Lab 07/24/15 0235 07/24/15 0825  AST 19 21  ALT 9* 11*  ALKPHOS 61 63  BILITOT 0.5 0.4  PROT 6.7 6.9  ALBUMIN 3.2* 3.3*   CBC:  Recent Labs Lab 07/23/15 1801 07/24/15 0825 07/25/15 0518  WBC 7.2 6.4 9.2  NEUTROABS 6.2 5.5  --   HGB 11.5* 11.2* 11.2*  HCT 37.7 37.3 39.1  MCV 90.0 92.6 96.8  PLT 163 165 174   CBG:  Recent Labs Lab 07/24/15 1638 07/24/15 2053 07/25/15 0725 07/25/15 1158 07/25/15 1655  GLUCAP 114* 140* 109* 119* 122*   Scheduled Meds: . aspirin EC  81 mg Oral Daily  . clindamycin (CLEOCIN) IV  600 mg Intravenous 3 times per day  . enoxaparin (LOVENOX) injection  80 mg Subcutaneous QHS  . escitalopram  10 mg Oral Daily  . insulin aspart  0-15 Units Subcutaneous TID WC  . insulin aspart  0-5 Units Subcutaneous QHS  . levothyroxine  100 mcg Oral QAC breakfast  . pantoprazole  40 mg Oral Daily  . rosuvastatin  20 mg Oral q1800  . sodium chloride  3 mL Intravenous Q12H   Continuous Infusions: . [START ON 07/26/2015] sodium chloride

## 2015-07-25 NOTE — Progress Notes (Signed)
[facility] Progress Note Patient Name: Amanda Gentry DOB: [birth year] MRN: [id number]   Date of Service  07/25/2015  HPI/Events of Note  Case discussed with Dr. Doyle Askew from [facility] Multiple medical issues, has bilateral airspace disease and acute hypercapnic respiratory failure, now with increased work of breathing Code status is DNI  eICU Interventions  BIPAP ordered per my discussion with Dr. Doyle Askew     Intervention Category Intermediate Interventions: Respiratory distress - evaluation and management  Amanda Gentry 07/25/2015, 6:47 PM

## 2015-07-25 NOTE — Progress Notes (Signed)
No urine output all day even with IVF at 75. Paged MD. Orders given for renal ultrasound.   Fritz Pickerel, RN

## 2015-07-25 NOTE — Clinical Social Work Placement (Signed)
CSW faxed information out to [facility] in anticipation of SNF recommendation at discharge. Awaiting PT evaluation.     Raynaldo Opitz, [facility] Clinical Social Worker cell #: [phone]    CLINICAL SOCIAL WORK PLACEMENT  NOTE  Date:  07/25/2015  Patient Details  Name: Amanda Gentry MRN: [id number] Date of Birth: [birth year]  Clinical Social Work is seeking post-discharge placement for this patient at the [place] level of care (*CSW will initial, date and re-position this form in  chart as items are completed):  Yes   Patient/family provided with [facility] Work Department's list of facilities offering this level of care within the geographic area requested by the patient (or if unable, by the patient's family).  Yes   Patient/family informed of their freedom to choose among providers that offer the needed level of care, that participate in Medicare, Medicaid or managed care program needed by the patient, have an available bed and are willing to accept the patient.  Yes   Patient/family informed of [place] ownership interest in [facility] and [facility], as well as of the fact that they are under no obligation to receive care at these facilities.  PASRR submitted to EDS on 07/25/15     PASRR number received on 07/25/15     Existing PASRR number confirmed on       FL2 transmitted to all facilities in geographic area requested by pt/family on 07/25/15     FL2 transmitted to all facilities within larger geographic area on       Patient informed that his/her managed care company has contracts with or will negotiate with certain facilities, including the following:            Patient/family informed of bed offers received.  Patient chooses bed at       Physician recommends and patient chooses bed at      Patient to be transferred to   on  .  Patient to be transferred to facility by        Patient family notified on   of transfer.  Name of family member notified:        PHYSICIAN       Additional Comment:    _______________________________________________ Standley Brooking, LCSW 07/25/2015, 3:40 PM

## 2015-07-26 DIAGNOSIS — J9602 Acute respiratory failure with hypercapnia: Secondary | ICD-10-CM | POA: Diagnosis present

## 2015-07-26 DIAGNOSIS — R748 Abnormal levels of other serum enzymes: Secondary | ICD-10-CM

## 2015-07-26 DIAGNOSIS — R0902 Hypoxemia: Secondary | ICD-10-CM

## 2015-07-26 DIAGNOSIS — J811 Chronic pulmonary edema: Secondary | ICD-10-CM | POA: Insufficient documentation

## 2015-07-26 DIAGNOSIS — J810 Acute pulmonary edema: Secondary | ICD-10-CM

## 2015-07-26 DIAGNOSIS — R7989 Other specified abnormal findings of blood chemistry: Secondary | ICD-10-CM | POA: Insufficient documentation

## 2015-07-26 LAB — URINE MICROSCOPIC-ADD ON

## 2015-07-26 LAB — URINALYSIS, ROUTINE W REFLEX MICROSCOPIC
Bilirubin Urine: NEGATIVE
GLUCOSE, UA: NEGATIVE mg/dL
Ketones, ur: NEGATIVE mg/dL
Nitrite: NEGATIVE
Specific Gravity, Urine: 1.02 (ref 1.005–1.030)
UROBILINOGEN UA: 0.2 mg/dL (ref 0.0–1.0)
pH: 5 (ref 5.0–8.0)

## 2015-07-26 LAB — CBC
HEMATOCRIT: 38.7 % (ref 36.0–46.0)
HEMOGLOBIN: 11.2 g/dL — AB (ref 12.0–15.0)
MCH: 27.7 pg (ref 26.0–34.0)
MCHC: 28.9 g/dL — ABNORMAL LOW (ref 30.0–36.0)
MCV: 95.8 fL (ref 78.0–100.0)
PLATELETS: 179 10*3/uL (ref 150–400)
RBC: 4.04 MIL/uL (ref 3.87–5.11)
RDW: 18 % — ABNORMAL HIGH (ref 11.5–15.5)
WBC: 11.4 10*3/uL — AB (ref 4.0–10.5)

## 2015-07-26 LAB — BASIC METABOLIC PANEL
ANION GAP: 6 (ref 5–15)
Anion gap: 4 — ABNORMAL LOW (ref 5–15)
BUN: 38 mg/dL — ABNORMAL HIGH (ref 6–20)
BUN: 42 mg/dL — AB (ref 6–20)
CALCIUM: 8.4 mg/dL — AB (ref 8.9–10.3)
CHLORIDE: 106 mmol/L (ref 101–111)
CO2: 23 mmol/L (ref 22–32)
CO2: 23 mmol/L (ref 22–32)
Calcium: 8.4 mg/dL — ABNORMAL LOW (ref 8.9–10.3)
Chloride: 107 mmol/L (ref 101–111)
Creatinine, Ser: 2.72 mg/dL — ABNORMAL HIGH (ref 0.44–1.00)
Creatinine, Ser: 3.11 mg/dL — ABNORMAL HIGH (ref 0.44–1.00)
GFR calc Af Amer: 17 mL/min — ABNORMAL LOW (ref >60)
GFR calc non Af Amer: 17 mL/min — ABNORMAL LOW (ref >60)
GFR, EST AFRICAN AMERICAN: 19 mL/min — AB (ref >60)
GFR, EST NON AFRICAN AMERICAN: 14 mL/min — AB (ref >60)
GLUCOSE: 121 mg/dL — AB (ref 65–99)
Glucose, Bld: 140 mg/dL — ABNORMAL HIGH (ref 65–99)
POTASSIUM: 5.9 mmol/L — AB (ref 3.5–5.1)
Potassium: 6.7 mmol/L (ref 3.5–5.1)
SODIUM: 135 mmol/L (ref 135–145)
Sodium: 134 mmol/L — ABNORMAL LOW (ref 135–145)

## 2015-07-26 LAB — PROCALCITONIN

## 2015-07-26 LAB — GLUCOSE, CAPILLARY
GLUCOSE-CAPILLARY: 138 mg/dL — AB (ref 65–99)
GLUCOSE-CAPILLARY: 116 mg/dL — AB (ref 65–99)
Glucose-Capillary: 126 mg/dL — ABNORMAL HIGH (ref 65–99)

## 2015-07-26 LAB — BRAIN NATRIURETIC PEPTIDE: B NATRIURETIC PEPTIDE 5: 462.6 pg/mL — AB (ref 0.0–100.0)

## 2015-07-26 LAB — HIV ANTIBODY (ROUTINE TESTING W REFLEX): HIV Screen 4th Generation wRfx: NONREACTIVE

## 2015-07-26 LAB — VANCOMYCIN, RANDOM: Vancomycin Rm: 15 ug/mL

## 2015-07-26 LAB — URIC ACID: URIC ACID, SERUM: 6.6 mg/dL (ref 2.3–6.6)

## 2015-07-26 MED ORDER — SODIUM CHLORIDE 0.9 % IV BOLUS (SEPSIS): 1000.0000 mL | Freq: Once | INTRAVENOUS | Status: DC

## 2015-07-26 MED ORDER — SODIUM POLYSTYRENE SULFONATE 15 GM/60ML PO SUSP
30.0000 g | Freq: Four times a day (QID) | ORAL | Status: AC
  Administered 2015-07-26 (×2): 30 g via ORAL
  Filled 2015-07-26 (×2): qty 120

## 2015-07-26 MED ORDER — CETYLPYRIDINIUM CHLORIDE 0.05 % MT LIQD
7.0000 mL | Freq: Two times a day (BID) | OROMUCOSAL | Status: DC
  Administered 2015-07-26 – 2015-07-27 (×4): 7 mL via OROMUCOSAL

## 2015-07-26 MED ORDER — SODIUM POLYSTYRENE SULFONATE 15 GM/60ML PO SUSP
30.0000 g | ORAL | Status: AC
  Administered 2015-07-26: 30 g via ORAL
  Filled 2015-07-26: qty 120

## 2015-07-26 MED ORDER — MORPHINE SULFATE (PF) 2 MG/ML IV SOLN
2.0000 mg | INTRAVENOUS | Status: DC | PRN
  Administered 2015-07-26 – 2015-07-27 (×3): 2 mg via INTRAVENOUS
  Filled 2015-07-26 (×3): qty 1

## 2015-07-26 MED ORDER — METOLAZONE 2.5 MG PO TABS
2.5000 mg | ORAL_TABLET | Freq: Once | ORAL | Status: DC
  Filled 2015-07-26: qty 1

## 2015-07-26 MED ORDER — FUROSEMIDE 10 MG/ML IJ SOLN
160.0000 mg | Freq: Once | INTRAVENOUS | Status: AC
  Administered 2015-07-26: 160 mg via INTRAVENOUS
  Filled 2015-07-26: qty 16

## 2015-07-26 MED ORDER — PHENAZOPYRIDINE HCL 100 MG PO TABS: 100.0000 mg | ORAL_TABLET | Freq: Three times a day (TID) | ORAL | Status: DC

## 2015-07-26 MED ORDER — VANCOMYCIN HCL IN DEXTROSE 1-5 GM/200ML-% IV SOLN
1000.0000 mg | Freq: Once | INTRAVENOUS | Status: AC
  Administered 2015-07-26: 1000 mg via INTRAVENOUS
  Filled 2015-07-26: qty 200

## 2015-07-26 MED ORDER — TRAMADOL HCL 50 MG PO TABS
50.0000 mg | ORAL_TABLET | Freq: Two times a day (BID) | ORAL | Status: DC | PRN
  Administered 2015-07-26: 50 mg via ORAL
  Filled 2015-07-26: qty 1

## 2015-07-26 MED ORDER — FUROSEMIDE 10 MG/ML IJ SOLN
40.0000 mg | Freq: Once | INTRAMUSCULAR | Status: AC
  Administered 2015-07-26: 40 mg via INTRAVENOUS
  Filled 2015-07-26: qty 4

## 2015-07-26 MED ORDER — VANCOMYCIN HCL 500 MG IV SOLR
500.0000 mg | Freq: Once | INTRAVENOUS | Status: DC
  Filled 2015-07-26: qty 500

## 2015-07-26 MED ORDER — CHLORHEXIDINE GLUCONATE 0.12 % MT SOLN
15.0000 mL | Freq: Two times a day (BID) | OROMUCOSAL | Status: DC
  Administered 2015-07-26 – 2015-07-27 (×4): 15 mL via OROMUCOSAL
  Filled 2015-07-26 (×3): qty 15

## 2015-07-26 MED ORDER — SODIUM CHLORIDE 0.9 % IV SOLN
INTRAVENOUS | Status: DC
  Administered 2015-07-26: 250 mL/h via INTRAVENOUS

## 2015-07-26 NOTE — Progress Notes (Signed)
Pt placed back on bipap at this time.  Rt will continue to monitor.

## 2015-07-26 NOTE — Progress Notes (Signed)
Patient complains that AVAPS settings are "too much" and she "can't go to bed" with the pressure in her face. Placed back on BiPAP settings, but unable to keep adequate exhaled tidal volumes and minute volume. Transitioned back to AVAPS and titrated Min P 14 EPAP 8 Max P 25 rate 16. She is reluctant to remain with full face mask during sleep, however, has agreed to continue trying after education on why it is important. Patient tolerating well at this time. Family remains at the bedside.

## 2015-07-26 NOTE — Progress Notes (Signed)
Chaplain was paged to visit with patient. Chaplain provided a listening presence. Patient was in need of spiritual guidance and  presented  Chaplain with questions that would assist her on her life's journey. Chaplain help guide patient with answers to her questions. Chaplain services are available upon request.    07/26/15 0900  Clinical Encounter Type  Visited With Patient and family together  Visit Type Initial;Spiritual support;Social support  Referral From Nurse  Consult/Referral To Chaplain

## 2015-07-26 NOTE — Progress Notes (Addendum)
Pt had two unsuccessful attempts of Kayexalate rectally previously. Unable to insert rectal tube, unable to stay in rectum even with balloon inflated attempted by RN and Charge, RN.  CCM MD aware.

## 2015-07-26 NOTE — Progress Notes (Signed)
Pt refused to take Kayexalate oral, pt is fully aware of her decisions.

## 2015-07-26 NOTE — Consult Note (Addendum)
Amanda Gentry Admit Date: 07/23/2015 07/26/2015 [place] Agent Requesting Physician:  Candiss Norse  Reason for Consult:  AKI, Hyperkalemia HPI:  87F seen at the request of Dr. seeing for the above issues. Patient with compensated numerous comorbidities including morbid obesity largely homebound, severe lymphedema of the legs bilaterally, history of sarcoma status post resection, hypertension, hyper lipidemia, type 2 diabetes. She was admitted on 07/23/15 with progressive shortness of breath. She was treated for diastolic heart failure exacerbation with IV diuretics. Because of some hypoxia she also underwent CTA of the chest on 07/24/15.  She has subsequently developed progressive renal failure and anuria. She has mild hyperkalemia and received Kayexalate this morning. She continued on her ACE inhibitor through 07/24/15. Her home medication list also includes diclofenac twice daily. She is receiving vancomycin and Ceftaz edema right now. Her CTA of the chest demonstrated interstitial edema and small pleural effusions. Renal ultrasound obtained yesterday was limited because of her habitus but demonstrated no obvious structural issues or hydronephrosis.  Blood pressures have been labile, low at times, with some challenges in measurement. She awakens to voice and briefly participates in the conversation before falling back asleep. Her sisters in the room. She acknowledges that yesterday evening the family had a conversation about her overall health outlook. Together we explored her current health challenges and difficulties in restoring her to health.   CREATININE, SER (mg/dL)  Date Value  07/26/2015 2.72*  07/25/2015 2.42*  07/25/2015 1.95*  07/25/2015 1.92*  07/24/2015 1.01*  07/23/2015 1.02*  01/29/2015 0.74  10/23/2013 0.8  08/01/2012 0.8  01/28/2012 0.8  ] I/Os:  ROS NSAIDS: on home med list IV Contrast exposure 9/1 TMP/SMX none Hypotension ?present currently Balance of 12 systems is  negative w/ exceptions as above  PMH  Past Medical History  Diagnosis Date  . ANXIETY 09/23/2007  . AORTIC INSUFFICIENCY 09/23/2007  . DEPRESSION 09/23/2007  . DIABETES MELLITUS, TYPE II 09/23/2007  . GERD 09/23/2007  . HYPERLIPIDEMIA 09/23/2007  . HYPERTENSION 09/23/2007  . HYPERTHYROIDISM 09/23/2007  . INSOMNIA 09/23/2007  . KNEE PAIN, RIGHT, CHRONIC 03/14/2008  . LOW BACK PAIN 09/23/2007  . Morbid obesity 09/23/2007  . OSTEOARTHRITIS 09/23/2007  . PERIPHERAL EDEMA 09/23/2007  . VITAMIN D DEFICIENCY 03/31/2010  . Degenerative arthritis of right knee 01/28/2012  . Acquired lymphedema of leg 01/29/2015   PSH  Past Surgical History  Procedure Laterality Date  . Abdominal hysterectomy    . Cholecystectomy    . Oophorectomy     FH  Family History  Problem Relation Age of Onset  . Hypertension Other   . Cancer Mother   . Heart disease Father   . Heart attack Father    [place]  reports that she has never smoked. She does not have any smokeless tobacco history on file. She reports that she does not drink alcohol or use illicit drugs. Allergies  Allergies  Allergen Reactions  . Lipitor [Atorvastatin]     Generic capsule causes bad taste in mouth   Home medications Prior to Admission medications   Medication Sig Start Date End Date Taking? Authorizing Provider  acetaminophen (TYLENOL) 500 MG tablet Take 1,000 mg by mouth every 6 (six) hours as needed for moderate pain.   Yes Historical Provider, MD  ALPRAZolam Duanne Moron) 0.5 MG tablet take 1 tablet by mouth once daily if needed for anxiety 02/18/15  Yes Biagio Borg, MD  amLODipine (NORVASC) 10 MG tablet Take 1 tablet (10 mg total) by mouth daily. 08/28/14  Yes  Biagio Borg, MD  aspirin 81 MG tablet Take 81 mg by mouth daily.     Yes Historical Provider, MD  benazepril (LOTENSIN) 40 MG tablet Take 1 tablet (40 mg total) by mouth daily. 08/28/14  Yes Biagio Borg, MD  diclofenac (VOLTAREN) 75 MG EC tablet take 1 tablet by mouth twice a day if  needed Patient taking differently: take 1 tablet by mouth twice a day if needed pain 01/07/15  Yes Biagio Borg, MD  escitalopram (LEXAPRO) 10 MG tablet Take 1 tablet (10 mg total) by mouth daily. 01/29/15 07/23/15 Yes Biagio Borg, MD  levothyroxine (SYNTHROID, LEVOTHROID) 100 MCG tablet Take 1 tablet (100 mcg total) by mouth daily. 08/28/14  Yes Biagio Borg, MD  metFORMIN (GLUCOPHAGE) 500 MG tablet Take 1 tablet (500 mg total) by mouth 2 (two) times daily with a meal. Patient taking differently: Take 500 mg by mouth daily.  08/28/14  Yes Biagio Borg, MD  Multiple Vitamins-Minerals (CENTRUM SILVER PO) Take 1 tablet by mouth daily.   Yes Historical Provider, MD  omeprazole (PRILOSEC) 20 MG capsule Take 1 capsule (20 mg total) by mouth daily. 10/23/13  Yes Biagio Borg, MD  pioglitazone (ACTOS) 45 MG tablet Take 1 tablet (45 mg total) by mouth daily. 08/28/14  Yes Biagio Borg, MD  rosuvastatin (CRESTOR) 20 MG tablet Take 1 tablet (20 mg total) by mouth daily. 08/28/14  Yes Biagio Borg, MD  traMADol Veatrice Bourbon) 50 MG tablet take 1 tablet by mouth four times a day if needed Patient taking differently: take 1 tablet by mouth four times a day if needed pain 07/15/15  Yes Biagio Borg, MD  hydrochlorothiazide (HYDRODIURIL) 25 MG tablet Take 1 tablet (25 mg total) by mouth daily. Patient not taking: Reported on 07/23/2015 08/28/14   Biagio Borg, MD  levofloxacin (LEVAQUIN) 250 MG tablet Take 1 tablet (250 mg total) by mouth daily. Patient not taking: Reported on 07/23/2015 08/28/14   Biagio Borg, MD  predniSONE (DELTASONE) 10 MG tablet 3 tabs by mouth per day for 3 days,2tabs per day for 3 days,1tab per day for 3 days Patient not taking: Reported on 07/23/2015 08/28/14   Biagio Borg, MD    Current Medications Scheduled Meds: . aspirin EC  81 mg Oral Daily  . cefTAZidime (FORTAZ)  IV  2 g Intravenous Q12H  . etomidate  0.3 mg/kg Intravenous Once  . furosemide  160 mg Intravenous Once  . heparin subcutaneous   5,000 Units Subcutaneous 3 times per day  . insulin aspart  0-15 Units Subcutaneous TID WC  . insulin aspart  0-5 Units Subcutaneous QHS  . levothyroxine  100 mcg Oral QAC breakfast  . metolazone  2.5 mg Oral Once  . midazolam  4 mg Intravenous Once  . pantoprazole  40 mg Oral Daily  . rocuronium  100 mg Intravenous Once  . sodium chloride  1,000 mL Intravenous Once  . sodium chloride  3 mL Intravenous Q12H  . sodium polystyrene  30 g Oral Q6H  . vancomycin  1,750 mg Intravenous Q24H   Continuous Infusions: . sodium chloride 250 mL/hr (07/26/15 0845)   PRN Meds:.sodium chloride, acetaminophen, ALPRAZolam, ondansetron (ZOFRAN) IV, sodium chloride, traMADol  CBC  Recent Labs Lab 07/23/15 1801 07/24/15 0825 07/25/15 0518 07/26/15 0340  WBC 7.2 6.4 9.2 11.4*  NEUTROABS 6.2 5.5  --   --   HGB 11.5* 11.2* 11.2* 11.2*  HCT 37.7 37.3 39.1 38.7  MCV 90.0 92.6 96.8 95.8  PLT 163 165 174 242   Basic Metabolic Panel  Recent Labs Lab 07/23/15 1801 07/24/15 0825 07/25/15 0658 07/25/15 0930 07/25/15 1755 07/26/15 0340  NA 141 139 135 139 137 135  K 5.0 5.5* 6.1* 5.9* 6.0* 5.9*  CL 113* 112* 106 109 106 106  CO2 23 24 24 24 25 23   GLUCOSE 109* 117* 130* 136* 131* 140*  BUN 33* 33* 35* 36* 39* 38*  CREATININE 1.02* 1.01* 1.92* 1.95* 2.42* 2.72*  CALCIUM 8.8* 8.7* 8.3* 8.7* 8.7* 8.4*    Physical Exam  Blood pressure 85/43, pulse 71, temperature 98.3 F (36.8 C), temperature source Oral, resp. rate 22, height 5\' 2"  (1.575 m), weight 177.81 kg (392 lb), SpO2 96 %. GEN: morbid obesity ENT: NCAT EYES: eyes closed CV: RRR PULM: Coarse bs/ bl ABD: obese, limited exam, soft SKIN: chronic venous stasis EXT:4+ edema throughout legs and midsection GU Foley catheter in place   Assessment/Plan 85F with sever morbid obesity, largely immobile, chronic ulcers, now withy AKI, hypotension, SOB  1. AKI 2/2 CIN, NSAID, ACEi 1. Normal baseline GFR 2. Renal US 9/2 w/o HN/structural  issues 3. Try high dose lasix for fluid overload, uncler if will respond 4. Discussed RRT with pt/sister;TRH noted that pt refused HD previous conversation which is reasonable 1. Not candidate for long term HD 2. Unclear if even candidate for temp HD -- unlikely to benefit if used Daily weights, Daily Renal Panel, Strict I/Os, Avoid nephrotoxins (NSAIDs, judicious IV Contrast) BMP this PM 2. Hypervolemia as above 3. Hyperkalemia: mild.  Kayexalate given, repeat BMP 1600 4. ? Hypotension,  PCCM seeing 5. Chronic Lympthedema, on Vanc/Ceftaz 6. Morbid Obesity 7. DM2   Pearson Grippe MD [phone] pgr 07/26/2015, 10:40 AM

## 2015-07-26 NOTE — Progress Notes (Signed)
Pt removed from bipap at [facility] stime for hour off.  Rt will continue to monitor.  Pt placed on 3lpm [place].

## 2015-07-26 NOTE — Progress Notes (Signed)
Patient placed on [place] at 3 L/min. Patient was not in distress and O2 saturations on 3 L/min were 99%. RT will continue to monitor.

## 2015-07-26 NOTE — Progress Notes (Signed)
ANTIBIOTIC CONSULT NOTE - FOLLOW UP  Pharmacy Consult for Vancomycin / Renal antibiotic adjustment Indication: Cellulitis, HCAP  Allergies  Allergen Reactions  . Lipitor [Atorvastatin]     Generic capsule causes bad taste in mouth    Patient Measurements: Height: 5\' 2"  (157.5 cm) Weight: (!) 392 lb (177.81 kg) IBW/kg (Calculated) : 50.1 Adjusted Body Weight:   Vital Signs: Temp: 98.3 F (36.8 C) (09/03 0800) Temp Source: Oral (09/03 0800) BP: 76/35 mmHg (09/03 1103) Pulse Rate: 67 (09/03 1103) Intake/Output from previous day: 09/02 0701 - 09/03 0700 In: 967.5 [P.O.:230; I.V.:447.5; IV Piggyback:200] Out: 30 [Urine:30] Intake/Output from this shift: Total I/O In: 462.5 [I.V.:312.5; Other:100; IV Piggyback:50] Out: -   Labs:  Recent Labs  07/24/15 0825 07/25/15 0518  07/25/15 0930 07/25/15 1755 07/26/15 0340  WBC 6.4 9.2  --   --   --  11.4*  HGB 11.2* 11.2*  --   --   --  11.2*  PLT 165 174  --   --   --  179  CREATININE 1.01*  --   < > 1.95* 2.42* 2.72*  < > = values in this interval not displayed. Estimated Creatinine Clearance: 31.2 mL/min (by C-G formula based on Cr of 2.72). No results for input(s): VANCOTROUGH, VANCOPEAK, VANCORANDOM, GENTTROUGH, GENTPEAK, GENTRANDOM, TOBRATROUGH, TOBRAPEAK, TOBRARND, AMIKACINPEAK, AMIKACINTROU, AMIKACIN in the last 72 hours.   Microbiology: Recent Results (from the past 720 hour(s))  Culture, blood (routine x 2)     Status: None (Preliminary result)   Collection Time: 07/24/15  2:35 AM  Result Value Ref Range Status   Specimen Description BLOOD LEFT ARM  Final   Special Requests BOTTLES DRAWN AEROBIC AND ANAEROBIC [place]  Final   Culture   Final    NO GROWTH 2 DAYS Performed at [facility]    Report Status PENDING  Incomplete  Culture, blood (routine x 2)     Status: None (Preliminary result)   Collection Time: 07/24/15  2:38 AM  Result Value Ref Range Status   Specimen Description BLOOD RIGHT HAND  Final   Special Requests BOTTLES DRAWN AEROBIC ONLY [place]  Final   Culture   Final    NO GROWTH 2 DAYS Performed at [facility]    Report Status PENDING  Incomplete  Culture, blood (routine x 2)     Status: None (Preliminary result)   Collection Time: 07/25/15  9:30 AM  Result Value Ref Range Status   Specimen Description BLOOD RIGHT HAND  Final   Special Requests IN PEDIATRIC BOTTLE [place]  Final   Culture   Final    NO GROWTH < 24 HOURS Performed at [facility]    Report Status PENDING  Incomplete  Culture, blood (routine x 2)     Status: None (Preliminary result)   Collection Time: 07/25/15  9:33 AM  Result Value Ref Range Status   Specimen Description BLOOD LEFT ARM  Final   Special Requests BOTTLES DRAWN AEROBIC ONLY [facility]  Final   Culture   Final    NO GROWTH < 24 HOURS Performed at [facility]    Report Status PENDING  Incomplete  MRSA PCR Screening     Status: None   Collection Time: 07/25/15  6:46 PM  Result Value Ref Range Status   MRSA by PCR NEGATIVE NEGATIVE Final    Comment:        The GeneXpert MRSA Assay (FDA approved for NASAL specimens only), is one component of  a comprehensive MRSA colonization surveillance program. It is not intended to diagnose MRSA infection nor to guide or monitor treatment for MRSA infections.     Anti-infectives    Start     Dose/Rate Route Frequency Ordered Stop   07/25/15 2200  vancomycin (VANCOCIN) 1,750 mg in sodium chloride 0.9 % 500 mL IVPB  Status:  Discontinued     1,750 mg 250 mL/hr over 120 Minutes Intravenous Every 24 hours 07/25/15 1853 07/26/15 1243   07/25/15 2000  cefTAZidime (FORTAZ) 2 g in dextrose 5 % 50 mL IVPB     2 g 100 mL/hr over 30 Minutes Intravenous Every 12 hours 07/25/15 1836     07/25/15 1400  clindamycin (CLEOCIN) IVPB 600 mg  Status:  Discontinued     600 mg 100 mL/hr over 30 Minutes Intravenous 3 times per day 07/25/15 0921 07/25/15 1836   07/25/15 0600  vancomycin (VANCOCIN) 1,250  mg in sodium chloride 0.9 % 250 mL IVPB  Status:  Discontinued     1,250 mg 166.7 mL/hr over 90 Minutes Intravenous Every 12 hours 07/24/15 1840 07/25/15 0829   07/24/15 2000  vancomycin (VANCOCIN) 2,000 mg in sodium chloride 0.9 % 500 mL IVPB     2,000 mg 250 mL/hr over 120 Minutes Intravenous  Once 07/24/15 1840 07/24/15 2143      Assessment: 41 yoF with morbid obesity, history of sarcoma, lymphedema of the leg, hypertension, dyslipidemia, GERD, diabetes type 2, depression, hypothyroidism presented with dyspnea, LE edema, and non healing wounds.  9/2 pt became more somnolent with ARF and transferred to SDU.  Has since continued to have worsening renal function and hypotension.  9/2 abnormal CXR - worsening edema vs infection vs ARDS   Pharmacy dosing vancomycin and renally adjusting ceftazidime.    Anti-infectives 9/1 >>vancomycin  >>    **Noted vancomycin dose held 9/2 so pt has only received loading dose of vanc 2g x 1 on 9/1.  9/2 >> clindamycin >> 9/2 9/2 >> ceftaz >>   Vitals/Labs WBC: trending up now elevated Tm24h: afebrile Renal: SCr bumped up yesterday, continues to rise, est CrCl CG 31 and N 22  Cultures 9/1 bldx2: NGTD 9/2 bldx2: NGTD 9/2 MRSA PCR: Neg 9/3 Urine cx: IP 9/2 legionella/strep urinary antigen ordered  Dose changes/levels: 9/3 random vanc level @ 1930 = _______  Goal of Therapy:  Vancomycin trough level 15-20 mcg/ml  Eradication of infection  Plan:  Continue ceftazidime 2g IV q12h for now Follow renal function closely to adjust as CrCl borderline to reduce to q24h regimen Check random vancomycin level tonight with change in renal function and re-dose as appropriate  Ralene Bathe, PharmD, BCPS 07/26/2015, 12:52 PM  Pager: [phone]

## 2015-07-26 NOTE — Progress Notes (Signed)
PT Cancellation Note  Patient Details Name: [name] MRN: [id number] DOB: [birth year]   Cancelled Treatment:     PT eval deferred this date at request of RN 2* pt condition - RN stats pt may need to be moved onto bipap.  Will follow.   Axelle Szwed 07/26/2015, 10:18 AM

## 2015-07-26 NOTE — Progress Notes (Signed)
Pt family at the bedside. Pt remains on Bipap.

## 2015-07-26 NOTE — Consult Note (Addendum)
PULMONARY / CRITICAL CARE MEDICINE   Name: Amanda Gentry MRN: [id number] DOB: September 04, 1946    ADMISSION DATE:  07/23/2015 CONSULTATION DATE:  07/25/2015  REFERRING MD :  Dr. Doyle Askew  CHIEF COMPLAINT:  Acute hypercapneic respiratory failure  INITIAL PRESENTATION:  Admitted with SOB and non-healing wounds.  STUDIES:  TTE 07/24/2015: Study Conclusions  - Left ventricle: The cavity size was normal. Systolic function was normal. The estimated ejection fraction was in the range of 50% to 55%. Wall motion was normal; there were no regional wall motion abnormalities. - Aortic valve: There was mild regurgitation. - Mitral valve: Calcified annulus. - Left atrium: The atrium was mildly dilated. - Pulmonary arteries: Systolic pressure was mildly to moderately increased. PA peak pressure: 46 mm Hg (S).  SIGNIFICANT EVENTS: Acute decompensation with respiratory acidosis and CHF. 9/3- scheduled BIPAP  VITAL SIGNS: Temp:  [97.5 F (36.4 C)-98.5 F (36.9 C)] 98.3 F (36.8 C) (09/03 0800) Pulse Rate:  [58-76] 71 (09/03 0900) Resp:  [13-23] 22 (09/03 0900) BP: (73-128)/(32-83) 85/43 mmHg (09/03 0900) SpO2:  [93 %-100 %] 96 % (09/03 0900) Weight:  [177.81 kg (392 lb)-178.264 kg (393 lb)] 177.81 kg (392 lb) (09/03 0500) HEMODYNAMICS:   VENTILATOR SETTINGS:   INTAKE / OUTPUT:  Intake/Output Summary (Last 24 hours) at 07/26/15 1023 Last data filed at 07/26/15 0900  Gross per 24 hour  Intake    950 ml  Output     30 ml  Net    920 ml    PHYSICAL EXAMINATION: General:  Obese, mild -mod respiratory distress Neuro:  CN II-XII grossly intact, follows commands HEENT:  Obese neck Cardiovascular:   + holosystolic murmur across precordium +Anasarca Lungs: rales bases, reduced Abdomen:  Obese, no ascites Skin:  Multiple skin tears with decubiti and weeping edema LE: b/l 3+ pitting edema.  LABS:  CBC  Recent Labs Lab 07/24/15 0825 07/25/15 0518 07/26/15 0340  WBC 6.4 9.2  11.4*  HGB 11.2* 11.2* 11.2*  HCT 37.3 39.1 38.7  PLT 165 174 179   Coag's No results for input(s): APTT, INR in the last 168 hours. BMET  Recent Labs Lab 07/25/15 0930 07/25/15 1755 07/26/15 0340  NA 139 137 135  K 5.9* 6.0* 5.9*  CL 109 106 106  CO2 24 25 23   BUN 36* 39* 38*  CREATININE 1.95* 2.42* 2.72*  GLUCOSE 136* 131* 140*   Electrolytes  Recent Labs Lab 07/24/15 0825  07/25/15 0930 07/25/15 1755 07/26/15 0340  CALCIUM 8.7*  < > 8.7* 8.7* 8.4*  MG 2.2  --   --   --   --   < > = values in this interval not displayed. Sepsis Markers No results for input(s): LATICACIDVEN, PROCALCITON, O2SATVEN in the last 168 hours. ABG  Recent Labs Lab 07/23/15 2344 07/25/15 1732 07/25/15 2100  PHART 7.347* 7.141* 7.156*  PCO2ART 41.2 66.0* 62.8*  PO2ART 73.0* 72.8* 73.7*   Liver Enzymes  Recent Labs Lab 07/24/15 0235 07/24/15 0825  AST 19 21  ALT 9* 11*  ALKPHOS 61 63  BILITOT 0.5 0.4  ALBUMIN 3.2* 3.3*   Cardiac Enzymes No results for input(s): TROPONINI, PROBNP in the last 168 hours. Glucose  Recent Labs Lab 07/24/15 1638 07/24/15 2053 07/25/15 0725 07/25/15 1158 07/25/15 1655 07/26/15 0752  GLUCAP 114* 140* 109* 119* 122* 126*    Imaging US Renal  07/25/2015   CLINICAL DATA:  Elevated creatinine, history diabetes mellitus, hypertension  EXAM: RENAL / URINARY TRACT ULTRASOUND COMPLETE  COMPARISON:  None.  FINDINGS: Right Kidney:  Length: 10.0 cm. Normal cortical thickness and echogenicity. No mass, hydronephrosis or shadowing calcification.  Left Kidney:  Length: 9.7 cm. Suboptimal visualization of LEFT kidney secondary to body habitus. Cortical thinning identified. No gross evidence of mass or hydronephrosis identified on limited assessment.  Bladder:  Decompressed by Foley catheter, not evaluated.  IMPRESSION:  Poor visualization of LEFT kidney secondary to body habitus, with no gross mass or hydronephrosis identified.  Unremarkable RIGHT kidney.    Electronically Signed   By: Lavonia Dana M.D.   On: 07/25/2015 16:16   Dg Chest Port 1 View  07/25/2015   CLINICAL DATA:  Lethargy, decreased responsiveness  EXAM: PORTABLE CHEST - 1 VIEW  COMPARISON:  CT 07/24/2015 and chest radiograph 07/23/2015  FINDINGS: Patchy hazy perihilar airspace opacities are slightly increased since previously. Cardiomegaly reidentified. Lungs are hypoaerated with crowding of the bronchovascular markings. Trace pleural effusions.  IMPRESSION: Increased patchy pulmonary parenchymal airspace opacities which are nonspecific but could indicate worsening edema in the setting of cardiomegaly. Infection or ARDS could appear similar.  Trace pleural effusions.   Electronically Signed   By: Conchita Paris M.D.   On: 07/25/2015 17:53     ASSESSMENT / PLAN:  69 yo female with MODS.  She has decompensated CHF with pulmonary edema and resulted hypoventilation resulting in acute hypercapneic/hypoxemic respiratory failure and respiratory acidosis.  She has an AKI that is likely contrast induced nephropathy vs cardio-renal syndrome.  PULMONARY  A: Acute hypercapneic/hypoxemic respiratory failure from pulmonary edema P:    - given WOB remain singh off BIPAP, would continue to schedule BIPAP 4 hr on max 30-1 hr off   -her last PH is disturbing and likely she remains with acidosis , given int lethargy  -pcxr requires further neg balance if we are able  -would repeat pcxr  -iof aggressive measures sought, may need cvp line  -no repeat abg needed  RENAL A:  Acute anuric renal failure (basline Scr 1.0), suspect contrast induced nephropathy given recent CTPE study, cardio-renal is plasible but MAPs are Adequate at this time. P:   - lasix will not change her overall renal outcome -renal called -[place] reviewed -bnp -consider holding lasix for low given borderline BP -consider re lowering volume rate  INFECTIOUS A:  R/o PNA, unlikely P:   BCx2 07/25/2015 Abx: Vanc/ceftaz Pct to limit  and dc abx with clinical suspicion low infection  DVT PPx:  - SQH TID  Goals of care: dnr noted Aggressive care would be futile and pain full Restarting pressors would not change her outcome FAMILY  - Updates: family updated today.  Ccm time 30 min   Lavon Paganini. Titus Mould, MD, [place] Pgr: [facility]

## 2015-07-26 NOTE — Progress Notes (Signed)
Dear Doctor: Amanda Gentry This patient has been identified as a candidate for PICC for the following reason (s): IV therapy over 48 hours, poor veins/poor circulatory system (CHF, COPD, emphysema, diabetes, steroid use, IV drug abuse, etc.) and restarts due to phlebitis and infiltration in 24 hours If you agree, please write an order for the indicated device. For any questions contact the Vascular Access Team at [phone] if no answer, please leave a message.  Thank you for supporting the early vascular access assessment program.

## 2015-07-26 NOTE — Progress Notes (Signed)
CRITICAL VALUE ALERT  Critical value received:  K+ 6.7  Date of notification:  07-26-15  Time of notification:  16:58  Critical value read back:  yes  Nurse who received alert:  Jerrel Ivory, RN  MD notified (1st page):  Dr. Alfonso Patten. [place]/Dr. Ronnie Derby  Time of first page:  24  MD notified (2nd page):  Time of second page:  Responding MD:  Dr. Mliss Fritz  Time MD responded:  17:15-7:18

## 2015-07-26 NOTE — Progress Notes (Signed)
Patient Demographics:    Amanda Gentry, is a 69 y.o. female, DOB - 09-07-1946, DJT:[id number]  Admit date - 07/23/2015   Admitting Physician Lavina Hamman, MD  Outpatient Primary MD for the patient is Cathlean Cower, MD  LOS - 3   Chief Complaint  Patient presents with  . Leg Swelling  . Weakness      Summary  69 y.o. female with morbid obesity, history of sarcoma, lymphedema of the leg, hypertension, dyslipidemia, GERD, diabetes type 2, depression, hypothyroidism presented with dyspnea, LE edema, and non healing wounds.   Major events since admission: 9/2 - more somnolent, Cr up to 1.9, transfer to SDU, placed on BiPAP . Has become oliguric with worsening renal function. Has also developed hypotension. Likely intravascularly depleted. Critical care and renal both will be consulted.  Patient wishes to be DO NOT RESUSCITATE and not placed on dialysis if declines she wants to be comfortable.    Subjective:    Amanda Gentry today has, No headache, No chest pain, No abdominal pain - No Nausea, No new weakness tingling or numbness, No Cough - continues to have mild shortness of breath.   Assessment  & Plan :     1.Acute on chronic hypoxic and hypercapnic respiratory failure. Likely multifactorial with combination of obesity related hypoventilation, obstructive sleep apnea, possible acute on chronic diastolic CHF with EF of 30% on this admission along with moderate pulmonary hypertension.  CT angiogram chest does not show PE. She has been diuresed but now is oliguric 2 and uric, creatinine is worsening and she is hypotensive. Appears to be intravascularly depleted. Pulmonary critical care on board and patient currently on BiPAP as she is hypercapnic with somnolence, currently nothing by mouth, renal has  been consulted as well for fluid management.  Her prognosis appears poor. Discussed this with patient and daughter bedside. Patient does not want to be dialyzed, wants to remain DO NOT RESUSCITATE. She is agreeable to BiPAP and medical treatment. Declines further she will focus on comfort.   2. Acute renal failure is now oliguric. Was on ACE inhibitor at home which is on hold, initially had to be diuresed due to CHF. Renal ultrasound nonacute, bladder scan unremarkable, Foley in place with minimal to no urine output despite high dose Lasix. Since she is hypotensive appears to be intravascularly depleted, will hydrate her with fluids and monitor. Renal consulted.   3. Hyperkalemia. Kayexalate orally given, fluids and Lasix as above. Renal consulted.    4. Essential hypertension. Blood pressure low hold medications.    5. Question of atrial fibrillation on admission. EKG was reviewed by previous physician with cardiologist on-call no A. Fib.    6. Chronic massive lymphedema with you but is ulcer. Some drainage from the right thigh wound, continue empiric IV anti-biotics, wound care consulted.   7. Morbid obesity follow with PCP.   8.DM type II. Continue on ISS for now.  CBG (last 3)   Recent Labs  07/25/15 1158 07/25/15 1655 07/26/15 0752  GLUCAP 119* 122* 126*      Code Status : DO NOT RESUSCITATE, poor prognosis explained to the daughter if declines further we will focus on keeping her comfortable. Patient clearly stated she does not want dialysis, no  CPR and no intubation. Daughter bedside.  Family Communication  : daughter bedside  Disposition Plan  : stepdown  Consults  :  Pulmonary critical care  Procedures  : CT angiogram of the chest with no PE. Nonspecific bilateral interstitial pattern.  DVT Prophylaxis  :   Heparin   Lab Results  Component Value Date   PLT 179 07/26/2015    Inpatient Medications  Scheduled Meds: . aspirin EC  81 mg Oral Daily  .  cefTAZidime (FORTAZ)  IV  2 g Intravenous Q12H  . etomidate  0.3 mg/kg Intravenous Once  . heparin subcutaneous  5,000 Units Subcutaneous 3 times per day  . insulin aspart  0-15 Units Subcutaneous TID WC  . insulin aspart  0-5 Units Subcutaneous QHS  . levothyroxine  100 mcg Oral QAC breakfast  . metolazone  2.5 mg Oral Once  . midazolam  4 mg Intravenous Once  . pantoprazole  40 mg Oral Daily  . rocuronium  100 mg Intravenous Once  . sodium chloride  1,000 mL Intravenous Once  . sodium chloride  3 mL Intravenous Q12H  . sodium polystyrene  30 g Oral Q6H  . vancomycin  1,750 mg Intravenous Q24H   Continuous Infusions: . sodium chloride 250 mL/hr (07/26/15 0845)   PRN Meds:.sodium chloride, acetaminophen, ALPRAZolam, ondansetron (ZOFRAN) IV, sodium chloride, traMADol  Antibiotics  :     Anti-infectives    Start     Dose/Rate Route Frequency Ordered Stop   07/25/15 2200  vancomycin (VANCOCIN) 1,750 mg in sodium chloride 0.9 % 500 mL IVPB     1,750 mg 250 mL/hr over 120 Minutes Intravenous Every 24 hours 07/25/15 1853     07/25/15 2000  cefTAZidime (FORTAZ) 2 g in dextrose 5 % 50 mL IVPB     2 g 100 mL/hr over 30 Minutes Intravenous Every 12 hours 07/25/15 1836     07/25/15 1400  clindamycin (CLEOCIN) IVPB 600 mg  Status:  Discontinued     600 mg 100 mL/hr over 30 Minutes Intravenous 3 times per day 07/25/15 0921 07/25/15 1836   07/25/15 0600  vancomycin (VANCOCIN) 1,250 mg in sodium chloride 0.9 % 250 mL IVPB  Status:  Discontinued     1,250 mg 166.7 mL/hr over 90 Minutes Intravenous Every 12 hours 07/24/15 1840 07/25/15 0829   07/24/15 2000  vancomycin (VANCOCIN) 2,000 mg in sodium chloride 0.9 % 500 mL IVPB     2,000 mg 250 mL/hr over 120 Minutes Intravenous  Once 07/24/15 1840 07/24/15 2143        Objective:   Filed Vitals:   07/26/15 0700 07/26/15 0800 07/26/15 0825 07/26/15 0900  BP: 96/65 76/39  85/43  Pulse: 67 67 69 71  Temp:  98.3 F (36.8 C)    TempSrc:  Oral     Resp: 15 13 22 22   Height:      Weight:      SpO2: 100% 100% 100% 96%    Wt Readings from Last 3 Encounters:  07/26/15 177.81 kg (392 lb)  07/23/15 140.615 kg (310 lb)  01/28/12 140.615 kg (310 lb)     Intake/Output Summary (Last 24 hours) at 07/26/15 1032 Last data filed at 07/26/15 0900  Gross per 24 hour  Intake    950 ml  Output     30 ml  Net    920 ml     Physical Exam  Awake Alert, Oriented X 3, No new F.N deficits, Normal affect Speculator.AT,PERRAL Supple Neck,No  JVD, No cervical lymphadenopathy appriciated.  Symmetrical Chest wall movement, Mod air movement bilaterally, bilateral minimal basilar rales, RRR,No Gallops,Rubs or new Murmurs, No Parasternal Heave +ve B.Sounds, Abd Soft, No tenderness, No organomegaly appriciated, No rebound - guarding or rigidity. No Cyanosis, massive generalized lymphedema with chronic lichenification of skin    Data Review:   Micro Results Recent Results (from the past 240 hour(s))  Culture, blood (routine x 2)     Status: None (Preliminary result)   Collection Time: 07/24/15  2:35 AM  Result Value Ref Range Status   Specimen Description BLOOD LEFT ARM  Final   Special Requests BOTTLES DRAWN AEROBIC AND ANAEROBIC Ronald  Final   Culture   Final    NO GROWTH 1 DAY Performed at [facility]    Report Status PENDING  Incomplete  Culture, blood (routine x 2)     Status: None (Preliminary result)   Collection Time: 07/24/15  2:38 AM  Result Value Ref Range Status   Specimen Description BLOOD RIGHT HAND  Final   Special Requests BOTTLES DRAWN AEROBIC ONLY Jolly  Final   Culture   Final    NO GROWTH 1 DAY Performed at [facility]    Report Status PENDING  Incomplete  MRSA PCR Screening     Status: None   Collection Time: 07/25/15  6:46 PM  Result Value Ref Range Status   MRSA by PCR NEGATIVE NEGATIVE Final    Comment:        The GeneXpert MRSA Assay (FDA approved for NASAL specimens only), is one component of  a comprehensive MRSA colonization surveillance program. It is not intended to diagnose MRSA infection nor to guide or monitor treatment for MRSA infections.     Radiology Reports Dg Chest 2 View  07/23/2015   CLINICAL DATA:  Shortness of breath for 2 days, lymphedema, hypertension  EXAM: CHEST  2 VIEW  COMPARISON:  01/28/2012  FINDINGS: Moderate enlargement of the cardiac silhouette is noted with central vascular congestion, patchy perihilar opacities, and underlying interstitial prominence. Small pleural effusions are noted. No acute osseous finding.  IMPRESSION: Cardiomegaly with probable early alveolar edema superimposed on interstitial edema although other alveolar filling processes could appear similar.   Electronically Signed   By: Conchita Paris M.D.   On: 07/23/2015 18:38   Ct Angio Chest Pe W/cm &/or Wo Cm  07/24/2015   CLINICAL DATA:  Worsening bilateral lower extremity edema. Hypoxia and shortness of breath, acute onset. Initial encounter.  EXAM: CT ANGIOGRAPHY CHEST WITH CONTRAST  TECHNIQUE: Multidetector CT imaging of the chest was performed using the standard protocol during bolus administration of intravenous contrast. Multiplanar CT image reconstructions and MIPs were obtained to evaluate the vascular anatomy.  CONTRAST:  144mL OMNIPAQUE IOHEXOL 350 MG/ML SOLN  COMPARISON:  Chest radiograph performed earlier today at 6:23 p.m.  FINDINGS: There is no evidence of significant pulmonary embolus. Evaluation for pulmonary embolus is suboptimal in areas of airspace consolidation.  Trace bilateral pleural effusions are noted. Patchy bilateral central airspace opacities are seen, with interstitial prominence and vague ground-glass airspace opacities bilaterally, likely reflecting pulmonary edema. There is no evidence of pneumothorax.  The mediastinum is mildly enlarged. A 1.6 cm periaortic node is seen. Remaining visualized mediastinal nodes are borderline normal in size. The great vessels are  grossly unremarkable in appearance. Incidental note is made of a direct origin of the left vertebral artery from the aortic arch. No pericardial effusion is identified. No axillary lymphadenopathy is  seen. The thyroid gland is unremarkable in appearance.  The visualized portions of the liver and spleen are unremarkable. A small amount of ascites is noted about the liver and stomach.  No acute osseous abnormalities are seen. There is loss of the joint space at the left glenohumeral joint, with associated sclerotic change.  Review of the MIP images confirms the above findings.  IMPRESSION: 1. No evidence of significant pulmonary embolus. 2. Trace bilateral pleural effusions noted. Patchy bilateral central airspace opacities, with interstitial prominence and vague ground-glass airspace opacities bilaterally, likely reflecting pulmonary edema. 3. Mild cardiomegaly. 4. 1.6 cm periaortic node, of uncertain significance. 5. Small amount of ascites noted about the liver and stomach. 6. Degenerative change at the left glenohumeral joint.   Electronically Signed   By: Garald Balding M.D.   On: 07/24/2015 01:05   US Renal  07/25/2015   CLINICAL DATA:  Elevated creatinine, history diabetes mellitus, hypertension  EXAM: RENAL / URINARY TRACT ULTRASOUND COMPLETE  COMPARISON:  None.  FINDINGS: Right Kidney:  Length: 10.0 cm. Normal cortical thickness and echogenicity. No mass, hydronephrosis or shadowing calcification.  Left Kidney:  Length: 9.7 cm. Suboptimal visualization of LEFT kidney secondary to body habitus. Cortical thinning identified. No gross evidence of mass or hydronephrosis identified on limited assessment.  Bladder:  Decompressed by Foley catheter, not evaluated.  IMPRESSION:  Poor visualization of LEFT kidney secondary to body habitus, with no gross mass or hydronephrosis identified.  Unremarkable RIGHT kidney.   Electronically Signed   By: Lavonia Dana M.D.   On: 07/25/2015 16:16   Dg Chest Port 1  View  07/25/2015   CLINICAL DATA:  Lethargy, decreased responsiveness  EXAM: PORTABLE CHEST - 1 VIEW  COMPARISON:  CT 07/24/2015 and chest radiograph 07/23/2015  FINDINGS: Patchy hazy perihilar airspace opacities are slightly increased since previously. Cardiomegaly reidentified. Lungs are hypoaerated with crowding of the bronchovascular markings. Trace pleural effusions.  IMPRESSION: Increased patchy pulmonary parenchymal airspace opacities which are nonspecific but could indicate worsening edema in the setting of cardiomegaly. Infection or ARDS could appear similar.  Trace pleural effusions.   Electronically Signed   By: Conchita Paris M.D.   On: 07/25/2015 17:53     CBC  Recent Labs Lab 07/23/15 1801 07/24/15 0825 07/25/15 0518 07/26/15 0340  WBC 7.2 6.4 9.2 11.4*  HGB 11.5* 11.2* 11.2* 11.2*  HCT 37.7 37.3 39.1 38.7  PLT 163 165 174 179  MCV 90.0 92.6 96.8 95.8  MCH 27.4 27.8 27.7 27.7  MCHC 30.5 30.0 28.6* 28.9*  RDW 17.7* 18.0* 18.5* 18.0*  LYMPHSABS 0.5* 0.6*  --   --   MONOABS 0.5 0.2  --   --   EOSABS 0.1 0.1  --   --   BASOSABS 0.0 0.0  --   --     Chemistries   Recent Labs Lab 07/24/15 0235 07/24/15 0825 07/25/15 0658 07/25/15 0930 07/25/15 1755 07/26/15 0340  NA  --  139 135 139 137 135  K  --  5.5* 6.1* 5.9* 6.0* 5.9*  CL  --  112* 106 109 106 106  CO2  --  24 24 24 25 23   GLUCOSE  --  117* 130* 136* 131* 140*  BUN  --  33* 35* 36* 39* 38*  CREATININE  --  1.01* 1.92* 1.95* 2.42* 2.72*  CALCIUM  --  8.7* 8.3* 8.7* 8.7* 8.4*  MG  --  2.2  --   --   --   --  AST 19 21  --   --   --   --   ALT 9* 11*  --   --   --   --   ALKPHOS 61 63  --   --   --   --   BILITOT 0.5 0.4  --   --   --   --    ------------------------------------------------------------------------------------------------------------------ estimated creatinine clearance is 31.2 mL/min (by C-G formula based on Cr of  2.72). ------------------------------------------------------------------------------------------------------------------  Recent Labs  07/24/15 0235  HGBA1C 6.5*   ------------------------------------------------------------------------------------------------------------------ No results for input(s): CHOL, HDL, LDLCALC, TRIG, CHOLHDL, LDLDIRECT in the last 72 hours. ------------------------------------------------------------------------------------------------------------------  Recent Labs  07/24/15 0825  TSH 2.051   ------------------------------------------------------------------------------------------------------------------ No results for input(s): VITAMINB12, FOLATE, FERRITIN, TIBC, IRON, RETICCTPCT in the last 72 hours.  Coagulation profile No results for input(s): INR, PROTIME in the last 168 hours.  No results for input(s): DDIMER in the last 72 hours.  Cardiac Enzymes No results for input(s): CKMB, TROPONINI, MYOGLOBIN in the last 168 hours.  Invalid input(s): CK ------------------------------------------------------------------------------------------------------------------ Invalid input(s): POCBNP   Time Spent in minutes   35   Veleda Mun K M.D on 07/26/2015 at 10:32 AM  Between 7am to 7pm - Pager - [phone]  After 7pm go to [email or web address] - password [facility]  [facility] -  Office  [phone]

## 2015-07-27 ENCOUNTER — Inpatient Hospital Stay (HOSPITAL_COMMUNITY): Payer: Medicare Other

## 2015-07-27 LAB — BASIC METABOLIC PANEL
ANION GAP: 10 (ref 5–15)
BUN: 44 mg/dL — AB (ref 6–20)
CO2: 18 mmol/L — ABNORMAL LOW (ref 22–32)
Calcium: 8.4 mg/dL — ABNORMAL LOW (ref 8.9–10.3)
Chloride: 108 mmol/L (ref 101–111)
Creatinine, Ser: 3.55 mg/dL — ABNORMAL HIGH (ref 0.44–1.00)
GFR, EST AFRICAN AMERICAN: 14 mL/min — AB (ref >60)
GFR, EST NON AFRICAN AMERICAN: 12 mL/min — AB (ref >60)
Glucose, Bld: 118 mg/dL — ABNORMAL HIGH (ref 65–99)
POTASSIUM: 5.6 mmol/L — AB (ref 3.5–5.1)
SODIUM: 136 mmol/L (ref 135–145)

## 2015-07-27 LAB — GLUCOSE, CAPILLARY
GLUCOSE-CAPILLARY: 123 mg/dL — AB (ref 65–99)
GLUCOSE-CAPILLARY: 114 mg/dL — AB (ref 65–99)
GLUCOSE-CAPILLARY: 97 mg/dL (ref 65–99)
GLUCOSE-CAPILLARY: 92 mg/dL (ref 65–99)
Glucose-Capillary: 114 mg/dL — ABNORMAL HIGH (ref 65–99)

## 2015-07-27 LAB — CBC
HEMATOCRIT: 36.4 % (ref 36.0–46.0)
HEMOGLOBIN: 10.9 g/dL — AB (ref 12.0–15.0)
MCH: 28 pg (ref 26.0–34.0)
MCHC: 29.9 g/dL — ABNORMAL LOW (ref 30.0–36.0)
MCV: 93.6 fL (ref 78.0–100.0)
Platelets: 133 10*3/uL — ABNORMAL LOW (ref 150–400)
RBC: 3.89 MIL/uL (ref 3.87–5.11)
RDW: 17.7 % — AB (ref 11.5–15.5)
WBC: 8.1 10*3/uL (ref 4.0–10.5)

## 2015-07-27 LAB — OSMOLALITY: OSMOLALITY: 304 mosm/kg — AB (ref 275–300)

## 2015-07-27 LAB — PROCALCITONIN: Procalcitonin: <0.1 ng/mL

## 2015-07-27 MED ORDER — METOLAZONE 10 MG PO TABS
10.0000 mg | ORAL_TABLET | Freq: Every day | ORAL | Status: DC
  Administered 2015-07-27 – 2015-07-28 (×2): 10 mg via ORAL
  Filled 2015-07-27 (×3): qty 1

## 2015-07-27 MED ORDER — FENTANYL CITRATE (PF) 100 MCG/2ML IJ SOLN
12.5000 ug | INTRAMUSCULAR | Status: DC | PRN
  Administered 2015-07-27 – 2015-07-28 (×3): 12.5 ug via INTRAVENOUS
  Filled 2015-07-27 (×3): qty 2

## 2015-07-27 MED ORDER — DEXTROSE 5 % IV SOLN: 2.0000 g | INTRAVENOUS | Status: DC

## 2015-07-27 MED ORDER — MORPHINE SULFATE (PF) 2 MG/ML IV SOLN
2.0000 mg | Freq: Once | INTRAVENOUS | Status: AC
  Administered 2015-07-27: 2 mg via INTRAVENOUS
  Filled 2015-07-27: qty 1

## 2015-07-27 MED ORDER — FUROSEMIDE 10 MG/ML IJ SOLN
160.0000 mg | Freq: Three times a day (TID) | INTRAVENOUS | Status: DC
  Administered 2015-07-27 – 2015-07-29 (×6): 160 mg via INTRAVENOUS
  Filled 2015-07-27 (×9): qty 16

## 2015-07-27 NOTE — Progress Notes (Signed)
ANTIBIOTIC CONSULT NOTE - FOLLOW UP  Pharmacy Consult for Vancomycin Indication: HCAP, cellulitis  Allergies  Allergen Reactions  . Lipitor [Atorvastatin]     Generic capsule causes bad taste in mouth    Patient Measurements: Height: 5\' 2"  (157.5 cm) Weight: (!) 394 lb (178.717 kg) IBW/kg (Calculated) : 50.1 Adjusted Body Weight:   Vital Signs: Temp: 99 F (37.2 C) (09/03 2345) BP: 91/59 mmHg (09/04 0308) Pulse Rate: 75 (09/04 0308) Intake/Output from previous day: 09/03 0701 - 09/04 0700 In: 642.5 [I.V.:442.5; IV Piggyback:100] Out: -  Intake/Output from this shift: Total I/O In: 120 [I.V.:70; IV Piggyback:50] Out: -   Labs:  Recent Labs  07/24/15 0825 07/25/15 0518  07/25/15 1755 07/26/15 0340 07/26/15 1614  WBC 6.4 9.2  --   --  11.4*  --   HGB 11.2* 11.2*  --   --  11.2*  --   PLT 165 174  --   --  179  --   CREATININE 1.01*  --   < > 2.42* 2.72* 3.11*  < > = values in this interval not displayed. Estimated Creatinine Clearance: 27.4 mL/min (by C-G formula based on Cr of 3.11).  Recent Labs  07/26/15 1935  VANCORANDOM 15     Microbiology: Recent Results (from the past 720 hour(s))  Culture, blood (routine x 2)     Status: None (Preliminary result)   Collection Time: 07/24/15  2:35 AM  Result Value Ref Range Status   Specimen Description BLOOD LEFT ARM  Final   Special Requests BOTTLES DRAWN AEROBIC AND ANAEROBIC [place]  Final   Culture   Final    NO GROWTH 2 DAYS Performed at [facility]    Report Status PENDING  Incomplete  Culture, blood (routine x 2)     Status: None (Preliminary result)   Collection Time: 07/24/15  2:38 AM  Result Value Ref Range Status   Specimen Description BLOOD RIGHT HAND  Final   Special Requests BOTTLES DRAWN AEROBIC ONLY [place]  Final   Culture   Final    NO GROWTH 2 DAYS Performed at [facility]    Report Status PENDING  Incomplete  Culture, blood (routine x 2)     Status: None (Preliminary result)    Collection Time: 07/25/15  9:30 AM  Result Value Ref Range Status   Specimen Description BLOOD RIGHT HAND  Final   Special Requests IN PEDIATRIC BOTTLE [place]  Final   Culture   Final    NO GROWTH < 24 HOURS Performed at [facility]    Report Status PENDING  Incomplete  Culture, blood (routine x 2)     Status: None (Preliminary result)   Collection Time: 07/25/15  9:33 AM  Result Value Ref Range Status   Specimen Description BLOOD LEFT ARM  Final   Special Requests BOTTLES DRAWN AEROBIC ONLY [facility]  Final   Culture   Final    NO GROWTH < 24 HOURS Performed at [facility]    Report Status PENDING  Incomplete  MRSA PCR Screening     Status: None   Collection Time: 07/25/15  6:46 PM  Result Value Ref Range Status   MRSA by PCR NEGATIVE NEGATIVE Final    Comment:        The GeneXpert MRSA Assay (FDA approved for NASAL specimens only), is one component of a comprehensive MRSA colonization surveillance program. It is not intended to diagnose MRSA infection nor to guide or monitor treatment  for MRSA infections.     Anti-infectives    Start     Dose/Rate Route Frequency Ordered Stop   07/26/15 2200  vancomycin (VANCOCIN) 500 mg in sodium chloride 0.9 % 100 mL IVPB  Status:  Discontinued     500 mg 100 mL/hr over 60 Minutes Intravenous  Once 07/26/15 2049 07/26/15 2103   07/26/15 2145  vancomycin (VANCOCIN) IVPB 1000 mg/200 mL premix     1,000 mg 200 mL/hr over 60 Minutes Intravenous  Once 07/26/15 2131 07/26/15 2245   07/25/15 2200  vancomycin (VANCOCIN) 1,750 mg in sodium chloride 0.9 % 500 mL IVPB  Status:  Discontinued     1,750 mg 250 mL/hr over 120 Minutes Intravenous Every 24 hours 07/25/15 1853 07/26/15 1243   07/25/15 2000  cefTAZidime (FORTAZ) 2 g in dextrose 5 % 50 mL IVPB     2 g 100 mL/hr over 30 Minutes Intravenous Every 12 hours 07/25/15 1836     07/25/15 1400  clindamycin (CLEOCIN) IVPB 600 mg  Status:  Discontinued     600 mg 100 mL/hr over 30  Minutes Intravenous 3 times per day 07/25/15 0921 07/25/15 1836   07/25/15 0600  vancomycin (VANCOCIN) 1,250 mg in sodium chloride 0.9 % 250 mL IVPB  Status:  Discontinued     1,250 mg 166.7 mL/hr over 90 Minutes Intravenous Every 12 hours 07/24/15 1840 07/25/15 0829   07/24/15 2000  vancomycin (VANCOCIN) 2,000 mg in sodium chloride 0.9 % 500 mL IVPB     2,000 mg 250 mL/hr over 120 Minutes Intravenous  Once 07/24/15 1840 07/24/15 2143      Assessment: Patient with worsening renal function.  Random vancomycin level 15; after 2gm x1 given.  Will dose vancomycin via levels due to renal function status and weight.  Goal of Therapy:  Vancomycin trough level 15-20 mcg/ml  Plan:  Measure antibiotic drug levels at steady state Follow up culture results  Given vancomycin 1gm iv x1 (given) Recheck level (random) at 9/5 AM labs  Will plan to redose if < 20  Nani Skillern Crowford 07/27/2015,4:08 AM

## 2015-07-27 NOTE — Consult Note (Signed)
PULMONARY / CRITICAL CARE MEDICINE   Name: Amanda Gentry MRN: [id number] DOB: [birth year]    ADMISSION DATE:  07/23/2015 CONSULTATION DATE:  07/25/2015  REFERRING MD :  Dr. Doyle Askew  CHIEF COMPLAINT:  Acute hypercapneic respiratory failure  INITIAL PRESENTATION:  Admitted with SOB and non-healing wounds.  STUDIES:  TTE 07/24/2015: Study Conclusions 50% to 55%. Wall motion was normal; there were no regional wall motion abnormalities. PA peak pressure: 46 mm Hg (S).  SIGNIFICANT EVENTS: Acute decompensation with respiratory acidosis and CHF. 9/3- scheduled BIPAP 9/4- remains on BIPAP, low urine noted, pos balance 700 cc  VITAL SIGNS: Temp:  [98 F (36.7 C)-99 F (37.2 C)] 98.5 F (36.9 C) (09/04 0400) Pulse Rate:  [62-75] 71 (09/04 0700) Resp:  [12-22] 17 (09/04 0700) BP: (51-129)/(31-88) 51/31 mmHg (09/04 0700) SpO2:  [89 %-100 %] 99 % (09/04 0600) FiO2 (%):  [50 %-60 %] 50 % (09/04 0308) Weight:  [178.7 kg (393 lb 15.4 oz)-178.717 kg (394 lb)] 178.7 kg (393 lb 15.4 oz) (09/04 0500) HEMODYNAMICS:   VENTILATOR SETTINGS: Vent Mode:  [-]  FiO2 (%):  [50 %-60 %] 50 % INTAKE / OUTPUT:  Intake/Output Summary (Last 24 hours) at 07/27/15 1057 Last data filed at 07/27/15 0600  Gross per 24 hour  Intake    220 ml  Output      0 ml  Net    220 ml    PHYSICAL EXAMINATION: General:  Obese, mild respiratory distress Neuro:  CN II-XII grossly intact, follows commands HEENT:  Obese neck Cardiovascular:   + holosystolic murmur across precordium +Anasarca - no changes Lungs: distant Abdomen:  Obese, no ascites, no r/g Skin:  5/4 edema LE: b/l 4+ pitting edema.  LABS:  CBC  Recent Labs Lab 07/25/15 0518 07/26/15 0340 07/27/15 0835  WBC 9.2 11.4* 8.1  HGB 11.2* 11.2* 10.9*  HCT 39.1 38.7 36.4  PLT 174 179 133*   Coag's No results for input(s): APTT, INR in the last 168 hours. BMET  Recent Labs Lab 07/26/15 0340 07/26/15 1614 07/27/15 0835  NA 135 134* 136   K 5.9* 6.7* 5.6*  CL 106 107 108  CO2 23 23 18*  BUN 38* 42* 44*  CREATININE 2.72* 3.11* 3.55*  GLUCOSE 140* 121* 118*   Electrolytes  Recent Labs Lab 07/24/15 0825  07/26/15 0340 07/26/15 1614 07/27/15 0835  CALCIUM 8.7*  < > 8.4* 8.4* 8.4*  MG 2.2  --   --   --   --   < > = values in this interval not displayed. Sepsis Markers  Recent Labs Lab 07/26/15 0340 07/27/15 0339  PROCALCITON <0.10 <0.10   ABG  Recent Labs Lab 07/23/15 2344 07/25/15 1732 07/25/15 2100  PHART 7.347* 7.141* 7.156*  PCO2ART 41.2 66.0* 62.8*  PO2ART 73.0* 72.8* 73.7*   Liver Enzymes  Recent Labs Lab 07/24/15 0235 07/24/15 0825  AST 19 21  ALT 9* 11*  ALKPHOS 61 63  BILITOT 0.5 0.4  ALBUMIN 3.2* 3.3*   Cardiac Enzymes No results for input(s): TROPONINI, PROBNP in the last 168 hours. Glucose  Recent Labs Lab 07/25/15 1158 07/25/15 1655 07/26/15 0752 07/26/15 1214 07/26/15 1609 07/26/15 2149  GLUCAP 119* 122* 126* 138* 116* 123*    Imaging Dg Chest Port 1 View  07/27/2015   CLINICAL DATA:  Respiratory failure, shortness of breath and acute renal failure.  EXAM: PORTABLE CHEST - 1 VIEW  COMPARISON:  07/25/2015 and prior exams  FINDINGS: Cardiomegaly and interstitial/airspace opacities bilaterally  are unchanged.  There is no evidence of pneumothorax.  No large pleural effusion is identified.  Little interval change since prior study noted.  IMPRESSION: Little significant change with continued interstitial and airspace opacities bilaterally which may represent edema or infection.   Electronically Signed   By: Margarette Canada M.D.   On: 07/27/2015 09:31     ASSESSMENT / PLAN:  69 yo female with MODS.  She has decompensated CHF with pulmonary edema and resulted hypoventilation resulting in acute hypercapneic/hypoxemic respiratory failure and respiratory acidosis.  She has an AKI that is likely contrast induced nephropathy vs cardio-renal syndrome.  PULMONARY  A: Acute  hypercapneic/hypoxemic respiratory failure from pulmonary edema P:    - pcxr slight improved but remains with edema  -again need neg balance, see renal for thos efforts  -cycled bipap required esepcially in setting hyeprkalemia and prior acidosis, so if without for long periods, K may rise as  ph worsens  - continued 4 hrs on 30 min off  -consider comfort care  - no evidence infection, consider dc all abx   RENAL A:  Acute anuric renal failure (basline Scr 1.0), suspect contrast induced nephropathy given recent CTPE study, cardio-renal is plasible but MAPs are Adequate at this time. P:   - lasix to 160 q8h, unlikely to change her renal outcome, hope for increased output for pulm edema - add zarox -chem in pm and am  -she stated that she does NOT want HD under any circumstances, unlikely to benefot, would be futile and painful -dc tramadol  INFECTIOUS A:  R/o PNA, unlikely P:   BCx2 07/25/2015 Abx: Vanc/ceftaz Pct neg,, low suspion pNA, dc all abx Remains culture neg  DVT PPx:  - SQH TID  - no lovenox with current renal failure  Goals of care: dnr noted Aggressive care would be futile and pain full Restarting pressors would not change her outcome Pall care to see Would should concentrate on her comfort FAMILY  - Updates: family updated today.  Ccm time 30 min   Lavon Paganini. Titus Mould, MD, [place] Pgr: [facility]

## 2015-07-27 NOTE — Progress Notes (Signed)
Pt removed from Bipap at this time to 3LPM [place] with sats at 96%.  HR-71, BP 64/34 RR-25  Rt will continue to monitor.

## 2015-07-27 NOTE — Consult Note (Signed)
WOC wound consult note Reason for Consult:Lymphedema, chronic skin changes of RLE with full thickness ulcer on posterior right LE Wound type: venous insufficiency Pressure Ulcer POA: No Measurement:3cm x 2cm x 0.2cm  Wound ELF:YBOFB red wound bed with moderate to large amount of serous exudate Drainage (amount, consistency, odor) See above Periwound: Edematous with chronic skin changes associated with lymphedema Dressing procedure/placement/frequency: Nursing staff are provided guidance for topical conservative therapy; current intervention is a soft silicone foam for which the capacity becomes quickly overwhelmed with exudate and becomes a medium for bacterial growth. Will change topical care to xeroform gauze (bismuth) which will assist with drying.  Conservative care plan includes pressure ulcer prevention and bilateral pressure redistribution boots are provided to prevent ulceration in the presence of increased weight and inability to independently change position. Patient is on a bariatric therapeutic sleep surface with low air loss feature.  A sacral prophylactic dressing order is provided to prevent sacral skin breakdown and may be placed with next routine change in position. [place] nursing team will not follow, but will remain available to this patient, the nursing and medical teams.  Please re-consult if needed. Thanks, Maudie Flakes, MSN, RN, [place], [place], [place] [phone])

## 2015-07-27 NOTE — Progress Notes (Signed)
Admit: 07/23/2015 LOS: 4  70F with sever morbid obesity, largely immobile, chronic ulcers, now withy AKI, hypotension, SOB  Subjective:  K 5.7 Anuric SCr worse Labile BP On BiPAP CCM talked with fmaily, suggested Palliative eval  Confirmed that no desire for HD  09/03 0701 - 09/04 0700 In: 682.5 [I.V.:482.5; IV Piggyback:100] Out: -   Filed Weights   07/26/15 0500 07/26/15 2345 07/27/15 0500  Weight: 177.81 kg (392 lb) 178.717 kg (394 lb) 178.7 kg (393 lb 15.4 oz)    Scheduled Meds: . antiseptic oral rinse  7 mL Mouth Rinse q12n4p  . aspirin EC  81 mg Oral Daily  . chlorhexidine  15 mL Mouth Rinse BID  . etomidate  0.3 mg/kg Intravenous Once  . furosemide  160 mg Intravenous 3 times per day  . heparin subcutaneous  5,000 Units Subcutaneous 3 times per day  . insulin aspart  0-15 Units Subcutaneous TID WC  . insulin aspart  0-5 Units Subcutaneous QHS  . levothyroxine  100 mcg Oral QAC breakfast  . metolazone  10 mg Oral Daily  . pantoprazole  40 mg Oral Daily  . rocuronium  100 mg Intravenous Once  . sodium chloride  1,000 mL Intravenous Once  . sodium chloride  3 mL Intravenous Q12H   Continuous Infusions:  PRN Meds:.sodium chloride, acetaminophen, ALPRAZolam, morphine injection, ondansetron (ZOFRAN) IV, sodium chloride  Current Labs: reviewed    Physical Exam:  Blood pressure 51/31, pulse 71, temperature 98.5 F (36.9 C), temperature source Oral, resp. rate 17, height 5\' 2"  (1.575 m), weight 178.7 kg (393 lb 15.4 oz), SpO2 99 %. GEN: morbid obesity ENT: NCAT EYES: eyes closed CV: RRR PULM: Coarse bs/ bl ABD: obese, limited exam, soft SKIN: chronic venous stasis EXT:4+ edema throughout legs and midsection GU Foley catheter in place  A/P 1. AKI 2/2 CIN, NSAID, ACEi 1. Normal baseline GFR 2. Renal US 9/2 w/o HN/structural issues 3. Unresponsive to high dose diuretics thus far 4. Pt clearly expressed desire for no HD, further not likey to benefit her 5. Daily  weights, Daily Renal Panel, Strict I/Os, Avoid nephrotoxins (NSAIDs, judicious IV Contrast) 2. Hypervolemia as above 3. Hyperkalemia: mild. improved p Kayexalate, would only give if > 6 4. ? Hypotension, PCCM seeing 5. Chronic Lympthedema, on Vanc/Ceftaz 6. Morbid Obesity 7. DM2  Pearson Grippe MD 07/27/2015, 12:11 PM   Recent Labs Lab 07/26/15 0340 07/26/15 1614 07/27/15 0835  NA 135 134* 136  K 5.9* 6.7* 5.6*  CL 106 107 108  CO2 23 23 18*  GLUCOSE 140* 121* 118*  BUN 38* 42* 44*  CREATININE 2.72* 3.11* 3.55*  CALCIUM 8.4* 8.4* 8.4*    Recent Labs Lab 07/23/15 1801 07/24/15 0825 07/25/15 0518 07/26/15 0340 07/27/15 0835  WBC 7.2 6.4 9.2 11.4* 8.1  NEUTROABS 6.2 5.5  --   --   --   HGB 11.5* 11.2* 11.2* 11.2* 10.9*  HCT 37.7 37.3 39.1 38.7 36.4  MCV 90.0 92.6 96.8 95.8 93.6  PLT 163 165 174 179 133*

## 2015-07-27 NOTE — Progress Notes (Signed)
ANTIBIOTIC CONSULT NOTE - FOLLOW UP  Pharmacy Consult for Vancomycin / Renal antibiotic adjustment Indication: Cellulitis, HCAP  Allergies  Allergen Reactions  . Lipitor [Atorvastatin]     Generic capsule causes bad taste in mouth    Patient Measurements: Height: 5\' 2"  (157.5 cm) Weight: (!) 393 lb 15.4 oz (178.7 kg) IBW/kg (Calculated) : 50.1 Adjusted Body Weight:   Vital Signs: Temp: 98.5 F (36.9 C) (09/04 0400) Temp Source: Oral (09/04 0400) BP: 51/31 mmHg (09/04 0700) Pulse Rate: 71 (09/04 0700) Intake/Output from previous day: 09/03 0701 - 09/04 0700 In: 682.5 [I.V.:482.5; IV Piggyback:100] Out: -  Intake/Output from this shift:    Labs:  Recent Labs  07/25/15 0518  07/26/15 0340 07/26/15 1614 07/27/15 0835  WBC 9.2  --  11.4*  --  8.1  HGB 11.2*  --  11.2*  --  10.9*  PLT 174  --  179  --  133*  CREATININE  --   < > 2.72* 3.11* 3.55*  < > = values in this interval not displayed. Estimated Creatinine Clearance: 24 mL/min (by C-G formula based on Cr of 3.55).  Recent Labs  07/26/15 1935  VANCORANDOM 15     Microbiology: Recent Results (from the past 720 hour(s))  Culture, blood (routine x 2)     Status: None (Preliminary result)   Collection Time: 07/24/15  2:35 AM  Result Value Ref Range Status   Specimen Description BLOOD LEFT ARM  Final   Special Requests BOTTLES DRAWN AEROBIC AND ANAEROBIC [place]  Final   Culture   Final    NO GROWTH 2 DAYS Performed at [facility]    Report Status PENDING  Incomplete  Culture, blood (routine x 2)     Status: None (Preliminary result)   Collection Time: 07/24/15  2:38 AM  Result Value Ref Range Status   Specimen Description BLOOD RIGHT HAND  Final   Special Requests BOTTLES DRAWN AEROBIC ONLY [place]  Final   Culture   Final    NO GROWTH 2 DAYS Performed at [facility]    Report Status PENDING  Incomplete  Culture, blood (routine x 2)     Status: None (Preliminary result)   Collection Time:  07/25/15  9:30 AM  Result Value Ref Range Status   Specimen Description BLOOD RIGHT HAND  Final   Special Requests IN PEDIATRIC BOTTLE [place]  Final   Culture   Final    NO GROWTH < 24 HOURS Performed at [facility]    Report Status PENDING  Incomplete  Culture, blood (routine x 2)     Status: None (Preliminary result)   Collection Time: 07/25/15  9:33 AM  Result Value Ref Range Status   Specimen Description BLOOD LEFT ARM  Final   Special Requests BOTTLES DRAWN AEROBIC ONLY 3[facility]  Final   Culture   Final    NO GROWTH < 24 HOURS Performed at [facility]    Report Status PENDING  Incomplete  MRSA PCR Screening     Status: None   Collection Time: 07/25/15  6:46 PM  Result Value Ref Range Status   MRSA by PCR NEGATIVE NEGATIVE Final    Comment:        The GeneXpert MRSA Assay (FDA approved for NASAL specimens only), is one component of a comprehensive MRSA colonization surveillance program. It is not intended to diagnose MRSA infection nor to guide or monitor treatment for MRSA infections.     Anti-infectives  Start     Dose/Rate Route Frequency Ordered Stop   07/28/15 0800  cefTAZidime (FORTAZ) 2 g in dextrose 5 % 50 mL IVPB     2 g 100 mL/hr over 30 Minutes Intravenous Every 24 hours 07/27/15 0921     07/26/15 2200  vancomycin (VANCOCIN) 500 mg in sodium chloride 0.9 % 100 mL IVPB  Status:  Discontinued     500 mg 100 mL/hr over 60 Minutes Intravenous  Once 07/26/15 2049 07/26/15 2103   07/26/15 2145  vancomycin (VANCOCIN) IVPB 1000 mg/200 mL premix     1,000 mg 200 mL/hr over 60 Minutes Intravenous  Once 07/26/15 2131 07/26/15 2245   07/25/15 2200  vancomycin (VANCOCIN) 1,750 mg in sodium chloride 0.9 % 500 mL IVPB  Status:  Discontinued     1,750 mg 250 mL/hr over 120 Minutes Intravenous Every 24 hours 07/25/15 1853 07/26/15 1243   07/25/15 2000  cefTAZidime (FORTAZ) 2 g in dextrose 5 % 50 mL IVPB  Status:  Discontinued     2 g 100 mL/hr over 30  Minutes Intravenous Every 12 hours 07/25/15 1836 07/27/15 0921   07/25/15 1400  clindamycin (CLEOCIN) IVPB 600 mg  Status:  Discontinued     600 mg 100 mL/hr over 30 Minutes Intravenous 3 times per day 07/25/15 0921 07/25/15 1836   07/25/15 0600  vancomycin (VANCOCIN) 1,250 mg in sodium chloride 0.9 % 250 mL IVPB  Status:  Discontinued     1,250 mg 166.7 mL/hr over 90 Minutes Intravenous Every 12 hours 07/24/15 1840 07/25/15 0829   07/24/15 2000  vancomycin (VANCOCIN) 2,000 mg in sodium chloride 0.9 % 500 mL IVPB     2,000 mg 250 mL/hr over 120 Minutes Intravenous  Once 07/24/15 1840 07/24/15 2143      Assessment: 87 yoF with morbid obesity, history of sarcoma, lymphedema of the leg, hypertension, dyslipidemia, GERD, diabetes type 2, depression, hypothyroidism presented with dyspnea, LE edema, and non healing wounds.  9/2 pt became more somnolent with ARF and transferred to SDU.  Has since continued to have worsening renal function and hypotension.  9/2 abnormal CXR - worsening edema vs infection vs ARDS   Pharmacy dosing vancomycin and renally adjusting ceftazidime.    Anti-infectives 9/1 >>vancomycin  >>  9/2 >> clindamycin >> 9/2 9/2 >> ceftaz >>   Vitals/Labs WBC: WNL Tm24h: afebrile Renal: ARF - SCr continues to rise, no urine output reported, CrCl ~27 ml/min est (N17)  Cultures 9/1 bldx2: NGTD 9/2 bldx2: NGTD 9/2 MRSA PCR: Neg 9/3 Urine cx: IP 9/2 legionella/strep urinary antigen ordered  Dose changes/levels: 9/3 VR 15 - 48h after 2g dose, SCr rising. Will dose via levels at this point.--redose when <20.  Goal of Therapy:  Vancomycin trough level 15-20 mcg/ml  Eradication of infection  Plan:  Reduce to ceftazidime 2g IV q24h due to decline in renal fxn (CrCl now <30 ml/min) Check random vancomycin level tomorrow AM (9/5) and redose vancomycin as appropriate  Ralene Bathe, PharmD, BCPS 07/27/2015, 9:24 AM  Pager: [phone]

## 2015-07-27 NOTE — Progress Notes (Signed)
Rt removed pt from bipap at this time.  Pt is crying that she no longer wants to wear.  Rt and Rn have been coaching her throughout the night on why she needs it.  Rt will continue to monitor.

## 2015-07-27 NOTE — Progress Notes (Addendum)
Patient ID: Amanda Gentry, female   DOB: 17-Jun-1946, 69 y.o.   MRN: [id number]  [facility] PROGRESS NOTE  Amanda Gentry NIO:[id number] DOB: Mar 07, 1946 DOA: 07/23/2015 PCP: Cathlean Cower, MD   Brief narrative:    69 y.o. female with morbid obesity, history of sarcoma, lymphedema of the leg, hypertension, dyslipidemia, GERD, diabetes type 2, depression, hypothyroidism presented with dyspnea, LE edema, and non healing wounds.   Major events since admission: 9/2 - more somnolent, Cr up to 1.9, transfer to SDU 9/3 - more hypotensive, Cr trending up, has been refusing BiPAP 9/4 - remains hypotensive, Cr trending up, d/w family transition to comfort, awaiting PCT input   Assessment/Plan:    Acute hypoxic respiratory failure with hypercarbia  - appears to be secondary to acute on chronic diastolic CHF - rather poor response to Lasix - has been on BiPAP but refusing - has also been on ABX for ? PNA but no clear evidence of PNA, will stop ABX  - d/w family allowing comfort as pt is still in significant pain, daughter has agreed to meet with PCT - DNR status confirmed   Acute anuric renal failure (basline Scr 1.0) - ? CIN, ATN - Cr continues trending up - pt not a candidate for HD, she is in too much pain at this time and very clear she does not want it   Hematuria - still noted - renal US reviewed, no obstruction, no source of bleeding   Hyperkalemia - per nephrology, no need for kayexalate unless K > 6 - repeat BMP in AM  Hypotension  - on Lasix but may need to hold based on BP control   Atrial fibrillation on admission EKG - reviewed by cardiologist, pt is in NSR - admission EKG reviewed and no evidence of a-fib   Right buttock area cellulitis with decubitus ulcers in LE's - Area of cellulitis at right hip measures 20cm x 17cm with 4cm x 3cm c 0.2cm  - Right posterior LE with full thickness tissue loss measuring 2cm round x 0.2cm  - Two areas of blanchable erythema on  bilateral buttocks on either side of the gluteal cleft measuring 10cm x 3cm  - in the setting of bed bound status - air mattress   Lymphedema with decubitus ulcer. - still with significant lymphedema which is chronic in nature  - Patient was found to have decubitus ulcer as well as right thigh ulcer, See above  - wound care team consulted for assistance, appreciate input   Morbid obesity - Body mass index is 72.04 kg/(m^2).  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    Ct Angio Chest Pe W/cm &/or Wo Cm 07/24/2015  No evidence of significant pulmonary embolus. 2. Trace bilateral pleural effusions noted. Patchy bilateral central airspace opacities, with interstitial prominence and vague ground-glass airspace opacities bilaterally, likely reflecting pulmonary edema. 3. Mild cardiomegaly. 4. 1.6 cm periaortic node, of uncertain significance. 5. Small amount of ascites noted about the liver and stomach.  6. Degenerative change at the left glenohumeral joint.   Medical Consultants:  Wound care  PCT Nephrology PCCM   Other Consultants:  None  IAnti-Infectives:   Vanc and Tressie Ellis 9/2 -->   Faye Ramsay, MD  [facility] Pager [phone]  If 7PM-7AM, please contact night-coverage [email or web address] Password [email or web address] 07/27/2015, 11:32 AM   LOS: 4 days   HPI/Subjective: No events overnight. More somnolent. In pain mostly right shoulder area and right hip area   Objective: Filed Vitals:    07/27/15 0400 07/27/15 0500 07/27/15 0600 07/27/15 0700  BP: 69/42 101/88 84/55 51/31   Pulse: 64 72 65 71  Temp: 98.5 F (36.9 C)     TempSrc: Oral     Resp: 15 21 20 17   Height:      Weight:  178.7 kg (393 lb 15.4 oz)    SpO2: 100% 95% 99%     Intake/Output Summary (Last 24 hours) at 07/27/15 1132 Last data filed at 07/27/15 0600  Gross per 24 hour  Intake    220 ml  Output      0 ml  Net    220 ml    Exam:   General:  Pt is somnolent but easy to arouse   Cardiovascular: Regular rate and rhythm, no rubs, no gallops  Respiratory: Distant hear sounds, diminished air movement at bases   Abdomen: Soft, non tender, non distended, bowel sounds present, no guarding  Extremities: bilateral venous stasis changes, +2 bilateral LE edema, right buttock area with erythema and drainage from open area, look better this AM   Data Reviewed: Basic Metabolic Panel:  Recent Labs Lab 07/24/15 0825  07/25/15 0930 07/25/15 1755 07/26/15 0340 07/26/15 1614 07/27/15 0835  NA 139  < > 139 137 135 134* 136  K 5.5*  < > 5.9* 6.0* 5.9* 6.7* 5.6*  CL 112*  < > 109 106 106 107 108  CO2 24  < > 24 25 23 23  18*  GLUCOSE 117*  < > 136* 131* 140* 121* 118*  BUN 33*  < > 36* 39* 38* 42* 44*  CREATININE 1.01*  < > 1.95* 2.42* 2.72* 3.11* 3.55*  CALCIUM 8.7*  < > 8.7* 8.7* 8.4* 8.4* 8.4*  MG 2.2  --   --   --   --   --   --   < > = values in this interval not displayed. Liver Function Tests:  Recent Labs Lab 07/24/15 0235 07/24/15 0825  AST 19 21  ALT 9* 11*  ALKPHOS 61 63  BILITOT 0.5 0.4  PROT 6.7 6.9  ALBUMIN 3.2* 3.3*   CBC:  Recent Labs Lab 07/23/15 1801 07/24/15 0825 07/25/15 0518 07/26/15 0340 07/27/15 0835  WBC 7.2 6.4 9.2 11.4* 8.1  NEUTROABS 6.2 5.5  --   --   --   HGB 11.5* 11.2* 11.2* 11.2* 10.9*  HCT 37.7 37.3 39.1 38.7 36.4  MCV 90.0 92.6 96.8 95.8 93.6  PLT 163 165 174 179 133*   CBG:  Recent Labs Lab 07/25/15 1655 07/26/15 0752 07/26/15 1214  07/26/15 1609 07/26/15 2149  GLUCAP 122* 126* 138* 116* 123*   Scheduled Meds: . antiseptic oral rinse  7 mL Mouth Rinse q12n4p  . aspirin EC  81 mg Oral Daily  . chlorhexidine  15 mL Mouth Rinse BID  . etomidate  0.3 mg/kg Intravenous Once  . furosemide  160 mg Intravenous 3 times per day  . heparin subcutaneous  5,000 Units Subcutaneous 3 times per day  .  insulin aspart  0-15 Units Subcutaneous TID WC  . insulin aspart  0-5 Units Subcutaneous QHS  . levothyroxine  100 mcg Oral QAC breakfast  . metolazone  10 mg Oral Daily  . pantoprazole  40 mg Oral Daily  . rocuronium  100 mg Intravenous Once  . sodium chloride  1,000 mL Intravenous Once  . sodium chloride  3 mL Intravenous Q12H   Continuous Infusions:

## 2015-07-27 NOTE — Progress Notes (Signed)
I met briefly with Amanda Gentry as well as her sister. At the time of my encounter she was being evaluated by wound care. She has significant pain related to her leg and recommend she be pretreated with pain medications prior to any movement or dressing changes. I reviewed her chart and she is currently ordered morphine on as-needed basis. Due to her renal status I made a transition to fentanyl on as-needed basis.  We have scheduled a family meeting for tomorrow morning at 8:30 AM. Full consult to follow at that time.   If there are urgent needs or questions please call [phone]. Thank you for consulting out team to assist with this patients care.  Micheline Rough, MD [facility] Team [phone]

## 2015-07-27 NOTE — Progress Notes (Signed)
RT came to take patient off BiPAP. [place] at 3 L was placed on patient. Patient is comfortable and no signs of distress are noted. RT will continue to monitor.

## 2015-07-28 DIAGNOSIS — Z515 Encounter for palliative care: Secondary | ICD-10-CM

## 2015-07-28 DIAGNOSIS — Z7189 Other specified counseling: Secondary | ICD-10-CM

## 2015-07-28 LAB — CBC
HCT: 34.3 % — ABNORMAL LOW (ref 36.0–46.0)
Hemoglobin: 10.5 g/dL — ABNORMAL LOW (ref 12.0–15.0)
MCH: 28.3 pg (ref 26.0–34.0)
MCHC: 30.6 g/dL (ref 30.0–36.0)
MCV: 92.5 fL (ref 78.0–100.0)
Platelets: 130 10*3/uL — ABNORMAL LOW (ref 150–400)
RBC: 3.71 MIL/uL — ABNORMAL LOW (ref 3.87–5.11)
RDW: 17.6 % — ABNORMAL HIGH (ref 11.5–15.5)
WBC: 7.7 10*3/uL (ref 4.0–10.5)

## 2015-07-28 LAB — URINE CULTURE

## 2015-07-28 LAB — BASIC METABOLIC PANEL
Anion gap: 7 (ref 5–15)
BUN: 50 mg/dL — AB (ref 6–20)
CHLORIDE: 107 mmol/L (ref 101–111)
CO2: 21 mmol/L — ABNORMAL LOW (ref 22–32)
CREATININE: 3.82 mg/dL — AB (ref 0.44–1.00)
Calcium: 8.3 mg/dL — ABNORMAL LOW (ref 8.9–10.3)
GFR calc Af Amer: 13 mL/min — ABNORMAL LOW (ref >60)
GFR calc non Af Amer: 11 mL/min — ABNORMAL LOW (ref >60)
GLUCOSE: 104 mg/dL — AB (ref 65–99)
Potassium: 5.4 mmol/L — ABNORMAL HIGH (ref 3.5–5.1)
SODIUM: 135 mmol/L (ref 135–145)

## 2015-07-28 LAB — GLUCOSE, CAPILLARY
GLUCOSE-CAPILLARY: 102 mg/dL — AB (ref 65–99)
GLUCOSE-CAPILLARY: 104 mg/dL — AB (ref 65–99)

## 2015-07-28 MED ORDER — FENTANYL 10 MCG/ML IV SOLN
INTRAVENOUS | Status: DC
  Administered 2015-07-28: 23:00:00 via INTRAVENOUS
  Administered 2015-07-28: 147.1 ug via INTRAVENOUS
  Administered 2015-07-28: 255.9 ug via INTRAVENOUS
  Administered 2015-07-29: 172.3 ug via INTRAVENOUS
  Administered 2015-07-29: 98.3 ug via INTRAVENOUS
  Administered 2015-07-29: 179.8 ug via INTRAVENOUS
  Administered 2015-07-29: 12:00:00 via INTRAVENOUS
  Administered 2015-07-29: 267.6 ug via INTRAVENOUS
  Administered 2015-07-29: 126.6 ug via INTRAVENOUS
  Administered 2015-07-29: 139 ug via INTRAVENOUS
  Administered 2015-07-30: 130 ug via INTRAVENOUS
  Administered 2015-07-30: 10:00:00 via INTRAVENOUS
  Administered 2015-07-30: 103 ug via INTRAVENOUS
  Administered 2015-07-30: 147.8 ug/h via INTRAVENOUS
  Filled 2015-07-28 (×5): qty 50

## 2015-07-28 MED ORDER — LORAZEPAM 1 MG PO TABS
1.0000 mg | ORAL_TABLET | ORAL | Status: DC | PRN
  Administered 2015-07-29: 1 mg via ORAL

## 2015-07-28 MED ORDER — LORAZEPAM 2 MG/ML PO CONC: 1.0000 mg | ORAL | Status: DC | PRN

## 2015-07-28 MED ORDER — GLYCOPYRROLATE 1 MG PO TABS
1.0000 mg | ORAL_TABLET | ORAL | Status: DC | PRN
  Filled 2015-07-28: qty 1

## 2015-07-28 MED ORDER — HALOPERIDOL 0.5 MG PO TABS
0.5000 mg | ORAL_TABLET | ORAL | Status: DC | PRN
  Filled 2015-07-28: qty 1

## 2015-07-28 MED ORDER — GLYCOPYRROLATE 0.2 MG/ML IJ SOLN
0.2000 mg | INTRAMUSCULAR | Status: DC | PRN
  Filled 2015-07-28: qty 1

## 2015-07-28 MED ORDER — FENTANYL 10 MCG/ML IV SOLN
INTRAVENOUS | Status: DC
  Filled 2015-07-28: qty 50

## 2015-07-28 MED ORDER — DIPHENHYDRAMINE HCL 12.5 MG/5ML PO ELIX: 12.5000 mg | ORAL_SOLUTION | Freq: Four times a day (QID) | ORAL | Status: DC | PRN

## 2015-07-28 MED ORDER — HALOPERIDOL LACTATE 2 MG/ML PO CONC
0.5000 mg | ORAL | Status: DC | PRN
  Filled 2015-07-28: qty 0.3

## 2015-07-28 MED ORDER — DIPHENHYDRAMINE HCL 50 MG/ML IJ SOLN: 12.5000 mg | Freq: Four times a day (QID) | INTRAMUSCULAR | Status: DC | PRN

## 2015-07-28 MED ORDER — FENTANYL CITRATE (PF) 100 MCG/2ML IJ SOLN
25.0000 ug | INTRAMUSCULAR | Status: DC | PRN
  Administered 2015-07-28 (×3): 25 ug via INTRAVENOUS
  Filled 2015-07-28 (×3): qty 2

## 2015-07-28 MED ORDER — LORAZEPAM 2 MG/ML IJ SOLN
0.5000 mg | Freq: Once | INTRAMUSCULAR | Status: AC
  Administered 2015-07-28: 0.5 mg via INTRAVENOUS
  Filled 2015-07-28: qty 1

## 2015-07-28 MED ORDER — LORAZEPAM 2 MG/ML IJ SOLN
1.0000 mg | INTRAMUSCULAR | Status: DC | PRN
  Administered 2015-07-29 (×2): 1 mg via INTRAVENOUS
  Filled 2015-07-28 (×4): qty 1

## 2015-07-28 MED ORDER — HALOPERIDOL LACTATE 5 MG/ML IJ SOLN: 0.5000 mg | INTRAMUSCULAR | Status: DC | PRN

## 2015-07-28 MED ORDER — ONDANSETRON HCL 4 MG/2ML IJ SOLN
4.0000 mg | Freq: Four times a day (QID) | INTRAMUSCULAR | Status: DC | PRN
Start: 2015-07-28 — End: 2015-07-31

## 2015-07-28 NOTE — Consult Note (Addendum)
Consultation Note Date: 07/28/2015   Patient Name: Amanda Gentry  DOB: [birth year]  MRN: [id number]  Age / Sex: 69 y.o., female   PCP: Biagio Borg, MD Referring Physician: Theodis Blaze, MD  Reason for Consultation: Establishing goals of care  Palliative Care Assessment and Plan Summary of Established Goals of Care and Medical Treatment Preferences   Clinical Assessment/Narrative: Amanda Gentry is a 69 year old female with complicated course including admission for chronic leg wounds related to lymphedema that started after she had surgery for sarcoma. Since admission she has had continued decline with hypercapnic/hypoxemic respiratory failure from pulmonary edema and acute anuric renal failure. Palliative was consulted for goals of care moving forward.  I met with the patient who asked that I talk with her family. I met with the patient's husband, her daughter, her sister, and her niece. We talked about her care to this point and they report that have clear understanding of her medical conditions.  We next had a long discussion regarding what is most important to the patient. They report that since she has been in the hospital she has been stating that she wants to be comfortable. She has had many discussions with her doctors and has been very clear that she did not want to consider dialysis and she understands she would be a very poor candidate for this. Her husband reports that she was willing to try a course of treatment for up to 7 days but this was based largely on her want to please family rather than her own desires.  I asked what they thought her intent was when she said this and they are in agreement that she was wanting to see if she would improve over a period of time. We discussed that her condition has not been improving with maximal medical therapy and that her overall prognosis remains very poor.  We discussed that her goal would be to pursue treatment if she had a high likelihood  of improving enough to transition home and spend quality time with her family. As this is unlikely to be the case based her current clinical situation, her family is in agreement that her wishes would be to focus on comfort care.  We went back to the patient's room and reviewed with her if her desire is to focus on comfort. She nodded yes this is the case.  - Transition to focus on comfort care. Comfort care orders placed via order set. She has been in severe amount of pain. Will plan for fentanyl 25 g continuous rate with a PCA for as needed dosing. If she loses ability to utilize PCA we will go back to basal rate with when necessary dosing. - Plan for transfer to 3 W. Will evaluate tomorrow to see how she is doing at that time. She has a very tenuous blood pressure and multiple problems including her respiratory failure. Hospital death is likely. If she does stabilize, she be a good candidate for residential hospice. We did not discuss this in any length today as I'm unsure she would be stable for discharge.   Contacts/Participants in Discussion: Primary Decision Maker: Patient and her family   HCPOA: None on chart   Code Status/Advance Care Planning:  DO NOT RESUSCITATE  Symptom Management:   Pain: Patient has been in a significant amount of pain. We will plan to increase her fentanyl as needed to ensure her comfort. I placed orders for continuous fentanyl infusion at 25 g an hour as she  has been getting bolus doses of approximately 25 g per hour. We'll also give her a PCA at this time as she is awake and will be able to utilize this in order to ensure maximal pain control. When she becomes more sedated and unable to utilize PCA we'll plan to transition back to continuous fentanyl infusion with when necessary doses.  Anxiety: Plan for Ativan as needed   Palliative Prophylaxis: Suppository as needed  Additional Recommendations (Limitations, Scope, Preferences):  Patient has significant  pain with any movements. Will plan to minimize movements is much as necessary. She also needs to be premedicated prior to any movement. Psycho-social/Spiritual:   Support System: Her family  Desire for further Chaplaincy support:no  Prognosis: Hours - Days  Discharge Planning:  To be determined by clinical course. High likelihood this will be terminal admission       Chief Complaint/History of Present Illness:  69 year old female with complex medical history complicated by acute respiratory failure and renal failure.  Primary Diagnoses  Present on Admission:  . Acute on chronic diastolic CHF (congestive heart failure) . Morbid obesity . KNEE PAIN, RIGHT, CHRONIC . GERD . Essential hypertension . Chronic venous insufficiency . Sarcoma . Acute respiratory failure with hypercapnia  Palliative Review of Systems: Pain and anxiety I have reviewed the medical record, interviewed the patient and family, and examined the patient. The following aspects are pertinent.  Past Medical History  Diagnosis Date  . ANXIETY 09/23/2007  . AORTIC INSUFFICIENCY 09/23/2007  . DEPRESSION 09/23/2007  . DIABETES MELLITUS, TYPE II 09/23/2007  . GERD 09/23/2007  . HYPERLIPIDEMIA 09/23/2007  . HYPERTENSION 09/23/2007  . HYPERTHYROIDISM 09/23/2007  . INSOMNIA 09/23/2007  . KNEE PAIN, RIGHT, CHRONIC 03/14/2008  . LOW BACK PAIN 09/23/2007  . Morbid obesity 09/23/2007  . OSTEOARTHRITIS 09/23/2007  . PERIPHERAL EDEMA 09/23/2007  . VITAMIN D DEFICIENCY 03/31/2010  . Degenerative arthritis of right knee 01/28/2012  . Acquired lymphedema of leg 01/29/2015   Social History   Social History  . Marital Status: Married    Spouse Name: N/A  . Number of Children: N/A  . Years of Education: N/A   Social History Main Topics  . Smoking status: Never Smoker   . Smokeless tobacco: None  . Alcohol Use: No  . Drug Use: No  . Sexual Activity: Not Asked   Other Topics Concern  . None   Social History Narrative    Family History  Problem Relation Age of Onset  . Hypertension Other   . Cancer Mother   . Heart disease Father   . Heart attack Father    Scheduled Meds: . etomidate  0.3 mg/kg Intravenous Once  . fentaNYL   Intravenous 6 times per day  . furosemide  160 mg Intravenous 3 times per day  . metolazone  10 mg Oral Daily  . sodium chloride  3 mL Intravenous Q12H   Continuous Infusions:  PRN Meds:.sodium chloride, diphenhydrAMINE **OR** diphenhydrAMINE, fentaNYL (SUBLIMAZE) injection, glycopyrrolate **OR** glycopyrrolate **OR** glycopyrrolate, haloperidol **OR** haloperidol **OR** haloperidol lactate, LORazepam **OR** LORazepam **OR** LORazepam, ondansetron (ZOFRAN) IV, sodium chloride Medications Prior to Admission:  Prior to Admission medications   Medication Sig Start Date End Date Taking? Authorizing Provider  acetaminophen (TYLENOL) 500 MG tablet Take 1,000 mg by mouth every 6 (six) hours as needed for moderate pain.   Yes Historical Provider, MD  ALPRAZolam Duanne Moron) 0.5 MG tablet take 1 tablet by mouth once daily if needed for anxiety 02/18/15  Yes Biagio Borg, MD  amLODipine (NORVASC) 10 MG tablet Take 1 tablet (10 mg total) by mouth daily. 08/28/14  Yes Biagio Borg, MD  aspirin 81 MG tablet Take 81 mg by mouth daily.     Yes Historical Provider, MD  benazepril (LOTENSIN) 40 MG tablet Take 1 tablet (40 mg total) by mouth daily. 08/28/14  Yes Biagio Borg, MD  diclofenac (VOLTAREN) 75 MG EC tablet take 1 tablet by mouth twice a day if needed Patient taking differently: take 1 tablet by mouth twice a day if needed pain 01/07/15  Yes Biagio Borg, MD  escitalopram (LEXAPRO) 10 MG tablet Take 1 tablet (10 mg total) by mouth daily. 01/29/15 07/23/15 Yes Biagio Borg, MD  levothyroxine (SYNTHROID, LEVOTHROID) 100 MCG tablet Take 1 tablet (100 mcg total) by mouth daily. 08/28/14  Yes Biagio Borg, MD  metFORMIN (GLUCOPHAGE) 500 MG tablet Take 1 tablet (500 mg total) by mouth 2 (two) times daily  with a meal. Patient taking differently: Take 500 mg by mouth daily.  08/28/14  Yes Biagio Borg, MD  Multiple Vitamins-Minerals (CENTRUM SILVER PO) Take 1 tablet by mouth daily.   Yes Historical Provider, MD  omeprazole (PRILOSEC) 20 MG capsule Take 1 capsule (20 mg total) by mouth daily. 10/23/13  Yes Biagio Borg, MD  pioglitazone (ACTOS) 45 MG tablet Take 1 tablet (45 mg total) by mouth daily. 08/28/14  Yes Biagio Borg, MD  rosuvastatin (CRESTOR) 20 MG tablet Take 1 tablet (20 mg total) by mouth daily. 08/28/14  Yes Biagio Borg, MD  traMADol Veatrice Bourbon) 50 MG tablet take 1 tablet by mouth four times a day if needed Patient taking differently: take 1 tablet by mouth four times a day if needed pain 07/15/15  Yes Biagio Borg, MD  hydrochlorothiazide (HYDRODIURIL) 25 MG tablet Take 1 tablet (25 mg total) by mouth daily. Patient not taking: Reported on 07/23/2015 08/28/14   Biagio Borg, MD  levofloxacin (LEVAQUIN) 250 MG tablet Take 1 tablet (250 mg total) by mouth daily. Patient not taking: Reported on 07/23/2015 08/28/14   Biagio Borg, MD  predniSONE (DELTASONE) 10 MG tablet 3 tabs by mouth per day for 3 days,2tabs per day for 3 days,1tab per day for 3 days Patient not taking: Reported on 07/23/2015 08/28/14   Biagio Borg, MD   Allergies  Allergen Reactions  . Lipitor [Atorvastatin]     Generic capsule causes bad taste in mouth   CBC:    Component Value Date/Time   WBC 7.7 07/28/2015 0357   HGB 10.5* 07/28/2015 0357   HCT 34.3* 07/28/2015 0357   PLT 130* 07/28/2015 0357   MCV 92.5 07/28/2015 0357   NEUTROABS 5.5 07/24/2015 0825   LYMPHSABS 0.6* 07/24/2015 0825   MONOABS 0.2 07/24/2015 0825   EOSABS 0.1 07/24/2015 0825   BASOSABS 0.0 07/24/2015 0825   Comprehensive Metabolic Panel:    Component Value Date/Time   NA 135 07/28/2015 0357   K 5.4* 07/28/2015 0357   CL 107 07/28/2015 0357   CO2 21* 07/28/2015 0357   BUN 50* 07/28/2015 0357   CREATININE 3.82* 07/28/2015 0357   GLUCOSE  104* 07/28/2015 0357   CALCIUM 8.3* 07/28/2015 0357   AST 21 07/24/2015 0825   ALT 11* 07/24/2015 0825   ALKPHOS 63 07/24/2015 0825   BILITOT 0.4 07/24/2015 0825   PROT 6.9 07/24/2015 0825   ALBUMIN 3.3* 07/24/2015 0825    Physical Exam: Vital Signs: BP 123/56 mmHg  Pulse 71  Temp(Src) 98.4 F (36.9 C) (Axillary)  Resp 19  Ht 5' 2"  (1.575 m)  Wt 179.171 kg (395 lb)  BMI 72.23 kg/m2  SpO2 92% SpO2: SpO2: 92 % O2 Device: O2 Device: Nasal Cannula O2 Flow Rate: O2 Flow Rate (L/min): 3 L/min Intake/output summary:  Intake/Output Summary (Last 24 hours) at 07/28/15 1926 Last data filed at 07/28/15 1530  Gross per 24 hour  Intake    156 ml  Output    950 ml  Net   -794 ml   LBM: Last BM Date: 07/27/15 Baseline Weight: Weight: (!) 166.47 kg (367 lb) Most recent weight: Weight: (!) 179.171 kg (395 lb)  Exam Findings:   General: Pt is somnolent but easy to arouse, she cries out in pain frequently   Cardiovascular: Regular rate and rhythm, no rubs, no gallops  Respiratory: Distant hear sounds, diminished air movement at bases   Abdomen: Soft, non tender, non distended, bowel sounds present, no guarding Extremities: bilateral venous stasis changes, significant lymphedema changes.        Palliative Performance Scale: 20               Additional Data Reviewed: Recent Labs     07/27/15  0835  07/28/15  0357  WBC  8.1  7.7  HGB  10.9*  10.5*  PLT  133*  130*  NA  136  135  BUN  44*  50*  CREATININE  3.55*  3.82*     Time In: 1000  Time Out: 1120 Time Total: 80 Greater than 50%  of this time was spent counseling and coordinating care related to the above assessment and plan.  Signed by: Micheline Rough, MD  Micheline Rough, MD  07/28/2015, 7:26 PM  Please contact Palliative Medicine Team phone at [phone] for questions and concerns.

## 2015-07-28 NOTE — Progress Notes (Signed)
Patient taken off of bipap and placed on 3L nasal cannula.  Currently tolerating well.  Will continue to monitor. 

## 2015-07-28 NOTE — Progress Notes (Signed)
I met with patient and her family this morning.  Elected to transition to focus on comfort care. I placed comfort care orders as well as request to transfer to 3W. She has been having inadequate pain control. Much of her pain revolves around movements and so I would keep moving her, especially her legs, to a minimum. She will be best served to stay in her current  hospital bed if  possible on transfer to 3 W.    Attending hospitalist notified via page. Full note to follow.  Micheline Rough, MD [facility] Team [phone]

## 2015-07-28 NOTE — Progress Notes (Addendum)
Patient ID: Amanda Gentry, female   DOB: 04/28/1946, 69 y.o.   MRN: [id number]  [facility] PROGRESS NOTE  Amanda Gentry INO:[id number] DOB: [birth year] DOA: 07/23/2015 PCP: Cathlean Cower, MD   Brief narrative:    69 y.o. female with morbid obesity, history of sarcoma, lymphedema of the leg, hypertension, dyslipidemia, GERD, diabetes type 2, depression, hypothyroidism presented with dyspnea, LE edema, and non healing wounds.   Major events since admission: 9/2 - more somnolent, Cr up to 1.9, transfer to SDU 9/3 - more hypotensive, Cr trending up, has been refusing BiPAP 9/4 - remains hypotensive, Cr trending up, d/w family transition to comfort, awaiting PCT input  9/5 - PCT met with family, decided pt now full comfort   Assessment/Plan:    Acute hypoxic respiratory failure with hypercarbia  - appears to be secondary to acute on chronic diastolic CHF - rather poor response to Lasix - has been on BiPAP but refusing - has also been on ABX for ? PNA but no clear evidence of PNA, ABX stopped  - after PCT met with family, decision made to transition to full comfort per pt's wishes   Acute anuric renal failure (basline Scr 1.0) - ? CIN, ATN - Cr continues trending up - pt not a candidate for HD, she is in too much pain at this time and very clear she does not want it  - allowing comfort per pt's wishes   Hematuria - still noted - renal US reviewed, no obstruction, no source of bleeding   Hyperkalemia - per nephrology, no need for kayexalate unless K > 6 - no further blood work to ensure comfort   Hypotension  - on Lasix but may need to hold based on BP control   Atrial fibrillation on admission EKG - reviewed by cardiologist, pt is in NSR - admission EKG reviewed and no evidence of a-fib   Right buttock area cellulitis with decubitus ulcers in LE's - Area of cellulitis at right hip measures 20cm x 17cm with 4cm x 3cm c 0.2cm  - Right posterior LE with full thickness  tissue loss measuring 2cm round x 0.2cm  - Two areas of blanchable erythema on bilateral buttocks on either side of the gluteal cleft measuring 10cm x 3cm  - in the setting of bed bound status - air mattress   Acute thrombocytopenia - still with blood in urine but no indication for transfusion - no further blood work to ensure comfort   Lymphedema with decubitus ulcer. - still with significant lymphedema which is chronic in nature  - Patient was found to have decubitus ulcer as well as right thigh ulcer, See above  - wound care team consulted for assistance, appreciate input   Morbid obesity - Body mass index is 72.23 kg/(m^2).  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    Ct Angio Chest Pe W/cm &/or Wo Cm 07/24/2015  No evidence of significant pulmonary embolus. 2. Trace bilateral pleural effusions noted. Patchy bilateral  central airspace opacities, with interstitial prominence and vague ground-glass airspace opacities bilaterally, likely reflecting pulmonary edema. 3. Mild cardiomegaly. 4. 1.6 cm periaortic node, of uncertain significance. 5. Small amount of ascites noted about the liver and stomach. 6. Degenerative change at the left glenohumeral joint.   Medical Consultants:  Wound care  PCT Nephrology PCCM   Other Consultants:  None  IAnti-Infectives:   Vanc and Tressie Ellis 9/2 --> 9/4  Faye Ramsay, MD  [facility] Pager [phone]  If 7PM-7AM, please contact  night-coverage [email or web address] Password [email or web address] 07/28/2015, 8:03 AM   LOS: 5 days   HPI/Subjective: No events overnight. More somnolent. In pain mostly right shoulder area and right hip area   Objective: Filed Vitals:   07/28/15 0355 07/28/15 0600 07/28/15 0700 07/28/15 0747  BP:  108/42 126/50 126/50  Pulse: 74 70 74 79  Temp:      TempSrc:      Resp: _0 Height:      Weight:      SpO2: 95% 96% 96% 96%    Intake/Output Summary (Last 24 hours) at 07/28/15 0803 Last data filed at 07/28/15 0700  Gross per 24 hour  Intake    266 ml  Output    250 ml  Net     16 ml    Exam:   General:  Pt is somnolent but easy to arouse   Cardiovascular: Regular rate and rhythm, no rubs, no gallops  Respiratory: Distant hear sounds, diminished air movement at bases   Abdomen: Soft, non tender, non distended, bowel sounds present, no guarding  Extremities: bilateral venous stasis changes, +2 bilateral LE edema, right buttock area with erythema and drainage from open area, look better this AM   Data Reviewed: Basic Metabolic Panel:  Recent Labs Lab 07/24/15 0825  07/25/15 1755 07/26/15 0340 07/26/15 1614 07/27/15 0835 07/28/15 0357  NA 139  < > 137 135 134* 136 135  K 5.5*  < > 6.0* 5.9* 6.7* 5.6* 5.4*  CL 112*  < > 106 106 107 108 107  CO2 24  < > _1 18* 21*  GLUCOSE 117*  < > 131* 140* 121* 118* 104*  BUN 33*  < > 39* 38* 42* 44* 50*  CREATININE 1.01*  < > 2.42* 2.72* 3.11* 3.55* 3.82*  CALCIUM 8.7*  < > 8.7* 8.4* 8.4* 8.4* 8.3*  MG 2.2  --   --   --   --   --   --   < > = values in this interval not displayed. Liver Function Tests:  Recent Labs Lab 07/24/15 0235 07/24/15 0825  AST 19 21  ALT 9* 11*  ALKPHOS 61 63  BILITOT 0.5 0.4  PROT 6.7 6.9  ALBUMIN 3.2* 3.3*   CBC:  Recent Labs Lab 07/23/15 1801 07/24/15 0825 07/25/15 0518 07/26/15 0340 07/27/15 0835 07/28/15 0357  WBC 7.2 6.4 9.2 11.4* 8.1 7.7  NEUTROABS 6.2 5.5  --   --   --   --    HGB 11.5* 11.2* 11.2* 11.2* 10.9* 10.5*  HCT 37.7 37.3 39.1 38.7 36.4 34.3*  MCV 90.0 92.6 96.8 95.8 93.6 92.5  PLT 163 165 174 179 133* 130*   CBG:  Recent Labs Lab 07/26/15 1609 07/26/15 2149 07/27/15 1207 07/27/15 1617 07/27/15 2139  GLUCAP 116* 123* 114* 97 92   Scheduled Meds: . antiseptic oral rinse  7 mL Mouth Rinse q12n4p  . aspirin EC  81 mg Oral Daily  .  chlorhexidine  15 mL Mouth Rinse BID  . etomidate  0.3 mg/kg Intravenous Once  . furosemide  160 mg Intravenous 3 times per day  . heparin subcutaneous  5,000 Units Subcutaneous 3 times per day  . insulin aspart  0-15 Units Subcutaneous TID WC  . insulin aspart  0-5 Units Subcutaneous QHS  . levothyroxine  100 mcg Oral QAC breakfast  . metolazone  10 mg Oral Daily  . pantoprazole  40 mg Oral Daily  . rocuronium  100 mg Intravenous Once  . sodium chloride  1,000 mL Intravenous Once  . sodium chloride  3 mL Intravenous Q12H   Continuous Infusions:

## 2015-07-28 NOTE — Progress Notes (Signed)
D/w pall care For comfort care Call if needed Will so   Lavon Paganini. Titus Mould, MD, [place] Pgr: [facility]

## 2015-07-28 NOTE — Progress Notes (Signed)
Per RN pt complaining about BIPAP making her head hurt and is asking to come off the BIPAP for a while.  Pt allowed a 30 MIN break from BIPAP per MD order.  Pt placed on [place] and is tolerating well at this time, RT to monitor and assess as needed.

## 2015-07-28 NOTE — Progress Notes (Signed)
PT Cancellation Note  Patient Details Name: Amanda Gentry MRN: [id number] DOB: 02/13/1946   Cancelled Treatment:    Reason Eval/Treat Not Completed: Other (comment) (PT signing off-per Palliative Care Team)   [facility] 07/28/2015, 9:43 AM

## 2015-07-28 NOTE — Progress Notes (Signed)
Have d/w palliative care.  No further aggressive Rx, plan is for transition to comfort care. Please call if needed, will sign off.   Kelly Splinter MD (pgr) [phone]    ([phone] 07/28/2015, 3:25 PM

## 2015-07-29 DIAGNOSIS — Z515 Encounter for palliative care: Secondary | ICD-10-CM | POA: Insufficient documentation

## 2015-07-29 DIAGNOSIS — R0602 Shortness of breath: Secondary | ICD-10-CM | POA: Insufficient documentation

## 2015-07-29 DIAGNOSIS — M25561 Pain in right knee: Secondary | ICD-10-CM

## 2015-07-29 LAB — CULTURE, BLOOD (ROUTINE X 2)
Culture: NO GROWTH
Culture: NO GROWTH

## 2015-07-29 MED ORDER — PROCHLORPERAZINE EDISYLATE 5 MG/ML IJ SOLN: 10.0000 mg | Freq: Four times a day (QID) | INTRAMUSCULAR | Status: DC | PRN

## 2015-07-29 MED ORDER — FENTANYL CITRATE (PF) 100 MCG/2ML IJ SOLN
25.0000 ug | Freq: Once | INTRAMUSCULAR | Status: AC
  Administered 2015-07-29: 25 ug via INTRAVENOUS

## 2015-07-29 MED ORDER — OLANZAPINE 5 MG PO TABS: 5.0000 mg | ORAL_TABLET | Freq: Every day | ORAL | Status: DC

## 2015-07-29 MED ORDER — FENTANYL CITRATE (PF) 100 MCG/2ML IJ SOLN
25.0000 ug | INTRAMUSCULAR | Status: DC | PRN
  Administered 2015-07-29 – 2015-07-31 (×19): 25 ug via INTRAVENOUS

## 2015-07-29 NOTE — Progress Notes (Addendum)
Daily Progress Note   Patient Name: Amanda Gentry       Date: 07/29/2015 DOB: 12/01/1945  Age: 69 y.o. MRN#: [id number] Attending Physician: Theodis Blaze, MD Primary Care Physician: Cathlean Cower, MD Admit Date: 07/23/2015  Reason for Consultation/Follow-up: Establishing goals of care and Pain control  Subjective: Patient reports she is still having significant pain. She cannot localize pain or describe pain. When asked where she is hurting she reports "just everything." Her nurse notes that she slept most of the day and her pain is very incidental in nature. She has increased pain when she has any movements. Additionally, she appears to be in significantly more pain when family is in the room and talking with her.   Her husband reports that he feels she has been having pain, but is also experiencing a lot of anxiety regarding everything going on. When I asked him if this is typical for her he reported that she will often have periods of anxiety and he felt the Ativan she received was beneficial to her in relieving her pain as well.  I also discussed and continuing Lasix with her husband. When I spoke with nephrology yesterday, they were in agreement that this would not be contributing to her comfort at this time. We therefore discontinued this as well today.  Length of Stay: 6 days  Current Medications: Scheduled Meds:  . etomidate  0.3 mg/kg Intravenous Once  . fentaNYL (SUBLIMAZE) injection  25 mcg Intravenous Once  . fentaNYL   Intravenous 6 times per day  . OLANZapine  5 mg Oral QHS  . sodium chloride  3 mL Intravenous Q12H    Continuous Infusions:    PRN Meds: sodium chloride, diphenhydrAMINE **OR** diphenhydrAMINE, fentaNYL (SUBLIMAZE) injection, glycopyrrolate **OR** glycopyrrolate **OR** glycopyrrolate, haloperidol **OR** haloperidol **OR** haloperidol lactate, LORazepam **OR** LORazepam **OR** LORazepam, ondansetron (ZOFRAN) IV, sodium chloride  Palliative Performance  Scale: 20%     Vital Signs: BP 118/50 mmHg  Pulse 77  Temp(Src) 97.5 F (36.4 C) (Oral)  Resp 14  Ht 5\' 2"  (1.575 m)  Wt 179.171 kg (395 lb)  BMI 72.23 kg/m2  SpO2 96% SpO2: SpO2: 96 % O2 Device: O2 Device: Nasal Cannula O2 Flow Rate: O2 Flow Rate (L/min): 5 L/min  Intake/output summary:  Intake/Output Summary (Last 24 hours) at 07/29/15 1932 Last data filed at 07/29/15 1814  Gross per 24 hour  Intake      0 ml  Output   9100 ml  Net  -9100 ml   LBM:   Baseline Weight: Weight: (!) 166.47 kg (367 lb) Most recent weight: Weight: (!) 179.171 kg (395 lb)  Physical Exam:  General: Pt is somnolent but easy to arouse, she bursts into tears when asked any questions   Cardiovascular: Regular rate and rhythm, no rubs, no gallops  Respiratory: Distant hear sounds, diminished air movement at bases   Abdomen: Soft, non tender, non distended, bowel sounds present, no guarding Extremities: bilateral venous stasis changes, significant lymphedema changes.              Additional Data Reviewed: Recent Labs     07/27/15  0835  07/28/15  0357  WBC  8.1  7.7  HGB  10.9*  10.5*  PLT  133*  130*  NA  136  135  BUN  44*  50*  CREATININE  3.55*  3.82*     Problem List:  Patient Active Problem List   Diagnosis Date Noted  . Acute respiratory  failure with hypercapnia 07/26/2015  . Creatinine elevation   . Hypoxia   . Pulmonary edema   . 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 Decubitus ulcer 07/24/2015  . Chronic venous insufficiency 07/23/2015  . Decubitus ulcer of buttock 07/23/2015  . Acute on chronic diastolic CHF (congestive heart failure) 07/23/2015  . Major depressive disorder, single episode, moderate 01/29/2015  . Acquired lymphedema of leg 01/29/2015  . Leg wound, right 01/29/2015  . Gait disorder 08/01/2012  . Urinary frequency 01/28/2012  . Left flank tenderness 01/28/2012  . Degenerative arthritis of right knee 01/28/2012  . Fatigue 01/28/2012  . Preventative health  care 07/30/2011  . VITAMIN D DEFICIENCY 03/31/2010  . KNEE PAIN, RIGHT, CHRONIC 03/14/2008  . HYPERTHYROIDISM 09/23/2007  . Diabetes 09/23/2007  . Hyperlipidemia 09/23/2007  . Morbid obesity 09/23/2007  . ANXIETY 09/23/2007  . DEPRESSION 09/23/2007  . Essential hypertension 09/23/2007  . AORTIC INSUFFICIENCY 09/23/2007  . GERD 09/23/2007  . OSTEOARTHRITIS 09/23/2007  . LOW BACK PAIN 09/23/2007  . INSOMNIA 09/23/2007     Palliative Care Assessment & Plan    Code Status:  DNR  Goals of Care:  Patient is Comfort Care only. We are still working on her symptom management. I did briefly discuss with her husband that if she is stable for discharge we may want to consider moving her to residential hospice facility in order to have her be out of the hospital which is important to her. We'll continue this conversation tomorrow.  Symptom Management:  Pain: Patient still reports she is having pain. She has been on continuous fentanyl infusion at 25 g an hour as well as having PCA available. It does not appear that she has been using many of the PCA doses. When discussed with nursing it appears she is often sleeping comfortably as long as nobody is moving her. She did report yesterday that her pain was largely incidental pain related to movements. However, when family comes and wakes her up she begins to cry. I think her pain is a complex problem that is complicated by the fact that she seems to be out of any pain when she is not moving or directly talking to family. I think she is having a combination of uncontrolled pain related to incident pain when she moves as well as a lot of emotional angst regarding unresolved issues with her family. In order to treat the incident pain, I discussed with nurse the need to continue to premedicate her prior to any moves. We added an as needed clinician dose of 25 g of fentanyl every 15 minutes to be used for this incident type pain. When I was in the room  with her, she reported she was having pain. Her bedside nurse gave her an additional dose of 25 g at that time. This seemed to help significantly. Consider discontinuing the PCA utilizing only as needed doses, however the patient reports she liked having the button available for as needed use.   Anxiety: The patient appears to be suffering from a great amount of emotional angst regarding approaching end-of-life. Based on dynamics of family meeting and discussion with family, I believe there may be unresolved issues in the family. She would likely benefit from chaplain consult and I placed this today. Her husband reports that her pain and anxiety was much better managed when she had dose of Ativan. Plan for Ativan as needed. Additionally, I added on a dose of Zyprexa at night to help with sleep as well as hopefully improve  her overall emotional lability.   Palliative Prophylaxis: Suppository as needed   Palliative Prophylaxis:  Dulcolax as needed  Psycho-social/Spiritual:  Desire for further Chaplaincy support:no   Prognosis: Hours - Days Discharge Planning: To be determined. Clinically it appears she is stabilizing and would likely be able to transfer from hospital. Will continue conversation regarding residential hospice placement tomorrow.   Care plan was discussed with patient and her husband  Thank you for allowing the Palliative Medicine Team to assist in the care of this patient.   Time In: 530 Time Out: 610 Total Time 40 Prolonged Time Billed no    Greater than 50%  of this time was spent counseling and coordinating care related to the above assessment and plan.   Micheline Rough, MD  07/29/2015, 7:32 PM  Please contact Palliative Medicine Team phone at [phone] for questions and concerns.

## 2015-07-29 NOTE — Progress Notes (Signed)
CM spoke with MD and waiting for Palliative Care Consult determination of plan: Residential vs. Terminal admission.  CM has spoken with family member in room who states husband and daughter will be to the hospital this evening.  CM spoke with CSW as sister of pt states if pt is discharged, residential hospice would be probable pathway vs. Home hospice.  Will continue to follow for disposition.

## 2015-07-29 NOTE — Progress Notes (Addendum)
Patient ID: DULCY SIDA, female   DOB: Apr 09, 1946, 69 y.o.   MRN: [id number]  [facility] PROGRESS NOTE  MARYAN SIVAK LNL:[id number] DOB: 10-01-1946 DOA: 07/23/2015 PCP: Cathlean Cower, MD   Brief narrative:    69 y.o. female with morbid obesity, history of sarcoma, lymphedema of the leg, hypertension, dyslipidemia, GERD, diabetes type 2, depression, hypothyroidism presented with dyspnea, LE edema, and non healing wounds.   Major events since admission: 9/2 - more somnolent, Cr up to 1.9, transfer to SDU 9/3 - more hypotensive, Cr trending up, has been refusing BiPAP 9/4 - remains hypotensive, Cr trending up, d/w family transition to comfort, awaiting PCT input  9/5 - PCT met with family, decided pt now full comfort, PCCM signed off, recommended palliative care and hospice   Assessment/Plan:    Acute hypoxic respiratory failure with hypercarbia  - secondary to acute on chronic diastolic CHF - rather poor response to Lasix - has been on BiPAP but refusing - has also been on ABX for ? PNA but all ABX stopped 9/5 as it was determined that pt has not been responding and further treatment determined futile, recommended focus on comfort as per pt wishes  - after PCT met with family, decision made to transition to full comfort per pt's wishes  - pt was transferred to [address] with focus on comfort   Acute anuric renal failure (basline Scr 1.0) - ? CIN, ATN - Cr continues trending up - pt not a candidate for HD and has been very clear that she did not want this to begin with - allowing comfort per pt's wishes  - per nephrology team, no further recommendations other than focus on comfort   Hematuria - resolved  - renal US reviewed, no obstruction, no source of bleeding   Hyperkalemia - per nephrology, no need for kayexalate unless K > 6 - no further blood work to ensure comfort   Hypotension  - no response to IVF and has been on Lasix with BP now more stable   Atrial  fibrillation on admission EKG - reviewed by cardiologist, pt is in NSR - admission EKG reviewed and no evidence of a-fib   Right buttock area cellulitis with decubitus ulcers in LE's - Area of cellulitis at right hip measures 20cm x 17cm with 4cm x 3cm c 0.2cm  - Right posterior LE with full thickness tissue loss measuring 2cm round x 0.2cm  - Two areas of blanchable erythema on bilateral buttocks on either side of the gluteal cleft measuring 10cm x 3cm  - in the setting of bed bound status - air mattress  - please allow comfort as changing pt's position causes to much pain   Acute thrombocytopenia - no further blood work to ensure comfort   Lymphedema with decubitus ulcer. - still with significant lymphedema which is chronic in nature  - Patient was found to have decubitus ulcer as well as right thigh ulcer, See above  - wound care team consulted for assistance, appreciate input   Morbid obesity - Body mass index is 72.23 kg/(m^2).  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 - continue with SSI for now  DVT prophylaxis - avoid SCD's and injections to allow comfort   Code Status: DNR Family Communication:  plan of care discussed with sister and daughter at length, PCT consulted, family also clear they want pt to be comfortable  Disposition Plan: Consult for [facility], d/c to residential hospice when bed available  IV access:  Peripheral IV  Procedures and diagnostic studies:    Dg Chest 2 View 07/23/2015  Cardiomegaly with probable early alveolar edema superimposed on interstitial edema although other alveolar filling processes could appear similar.    Ct Angio Chest Pe W/cm &/or Wo Cm 07/24/2015  No evidence of significant pulmonary embolus. 2. Trace bilateral pleural effusions noted. Patchy bilateral central airspace opacities, with interstitial prominence and vague ground-glass airspace opacities bilaterally, likely reflecting pulmonary edema. 3. Mild  cardiomegaly. 4. 1.6 cm periaortic node, of uncertain significance. 5. Small amount of ascites noted about the liver and stomach. 6. Degenerative change at the left glenohumeral joint.   Medical Consultants:  Wound care  PCT Nephrology PCCM   Other Consultants:  None  IAnti-Infectives:   Vanc and Tressie Ellis 9/2 --> 9/4  Faye Ramsay, MD  [facility] Pager [phone]  If 7PM-7AM, please contact night-coverage [email or web address] Password [facility] 07/29/2015, 6:27 PM   LOS: 6 days   HPI/Subjective: No events overnight. Sleeping, comfortable, NAD Objective: Filed Vitals:   07/29/15 0613 07/29/15 0757 07/29/15 1050 07/29/15 1210  BP: 108/39  118/50   Pulse: 75  77   Temp: 98.3 F (36.8 C)  97.5 F (36.4 C)   TempSrc: Oral  Oral   Resp: 19 16 13 13   Height:      Weight:      SpO2: 98% 95% 94% 96%    Intake/Output Summary (Last 24 hours) at 07/29/15 1827 Last data filed at 07/29/15 1814  Gross per 24 hour  Intake      0 ml  Output   9100 ml  Net  -9100 ml    Exam:   General:  Pt is lethargic, NAD  Cardiovascular: Regular rate and rhythm, no rubs, no gallops  Respiratory: Distant hear sounds, diminished air movement at bases   Abdomen: Soft, non tender, non distended, bowel sounds present, no guarding  Extremities: bilateral venous stasis changes, +2 bilateral LE edema, right buttock area with erythema and drainage from open area, look better this AM   Data Reviewed: Basic Metabolic Panel:  Recent Labs Lab 07/24/15 0825  07/25/15 1755 07/26/15 0340 07/26/15 1614 07/27/15 0835 07/28/15 0357  NA 139  < > 137 135 134* 136 135  K 5.5*  < > 6.0* 5.9* 6.7* 5.6* 5.4*  CL 112*  < > 106 106 107 108 107  CO2 24  < > 25 23 23  18* 21*  GLUCOSE 117*  < > 131* 140* 121* 118* 104*  BUN 33*  < > 39* 38* 42* 44* 50*  CREATININE 1.01*  < > 2.42* 2.72* 3.11* 3.55* 3.82*  CALCIUM 8.7*  < > 8.7* 8.4* 8.4* 8.4* 8.3*  MG 2.2  --   --   --   --   --   --   < > = values in this  interval not displayed. Liver Function Tests:  Recent Labs Lab 07/24/15 0235 07/24/15 0825  AST 19 21  ALT 9* 11*  ALKPHOS 61 63  BILITOT 0.5 0.4  PROT 6.7 6.9  ALBUMIN 3.2* 3.3*   CBC:  Recent Labs Lab 07/23/15 1801 07/24/15 0825 07/25/15 0518 07/26/15 0340 07/27/15 0835 07/28/15 0357  WBC 7.2 6.4 9.2 11.4* 8.1 7.7  NEUTROABS 6.2 5.5  --   --   --   --   HGB 11.5* 11.2* 11.2* 11.2* 10.9* 10.5*  HCT 37.7 37.3 39.1 38.7 36.4 34.3*  MCV 90.0 92.6 96.8 95.8 93.6 92.5  PLT 163 165  174 179 133* 130*   CBG:  Recent Labs Lab 07/27/15 1207 07/27/15 1617 07/27/15 2139 07/28/15 0738 07/28/15 1239  GLUCAP 114* 97 92 102* 104*   Scheduled Meds: . etomidate  0.3 mg/kg Intravenous Once  . fentaNYL (SUBLIMAZE) injection  25 mcg Intravenous Once  . fentaNYL   Intravenous 6 times per day  . OLANZapine  5 mg Oral QHS  . sodium chloride  3 mL Intravenous Q12H   Continuous Infusions:

## 2015-07-30 DIAGNOSIS — I5033 Acute on chronic diastolic (congestive) heart failure: Principal | ICD-10-CM

## 2015-07-30 DIAGNOSIS — J9602 Acute respiratory failure with hypercapnia: Secondary | ICD-10-CM

## 2015-07-30 DIAGNOSIS — I890 Lymphedema, not elsewhere classified: Secondary | ICD-10-CM

## 2015-07-30 DIAGNOSIS — F4322 Adjustment disorder with anxiety: Secondary | ICD-10-CM

## 2015-07-30 DIAGNOSIS — R1084 Generalized abdominal pain: Secondary | ICD-10-CM

## 2015-07-30 LAB — CULTURE, BLOOD (ROUTINE X 2)
CULTURE: NO GROWTH
CULTURE: NO GROWTH

## 2015-07-30 MED ORDER — FENTANYL 10 MCG/ML IV SOLN
INTRAVENOUS | Status: DC
  Administered 2015-07-30: 17:00:00 via INTRAVENOUS
  Administered 2015-07-30: 307.2 ug via INTRAVENOUS
  Administered 2015-07-31: 09:00:00 via INTRAVENOUS
  Administered 2015-07-31: 537.5 ug via INTRAVENOUS
  Administered 2015-07-31: 0.01 ug via INTRAVENOUS
  Administered 2015-07-31: 275.3 ug via INTRAVENOUS
  Filled 2015-07-30 (×4): qty 50

## 2015-07-30 MED ORDER — OLANZAPINE 5 MG PO TABS
7.5000 mg | ORAL_TABLET | Freq: Every day | ORAL | Status: DC
  Administered 2015-07-30: 7.5 mg via ORAL
  Filled 2015-07-30: qty 2

## 2015-07-30 MED ORDER — FENTANYL 10 MCG/ML IV SOLN: INTRAVENOUS | Status: DC

## 2015-07-30 MED ORDER — LORAZEPAM 2 MG/ML IJ SOLN
2.0000 mg | INTRAMUSCULAR | Status: DC | PRN
  Administered 2015-07-30 – 2015-07-31 (×3): 2 mg via INTRAVENOUS
  Filled 2015-07-30 (×3): qty 1

## 2015-07-30 MED ORDER — LORAZEPAM 2 MG/ML PO CONC: 1.0000 mg | ORAL | Status: DC | PRN

## 2015-07-30 MED ORDER — LORAZEPAM 1 MG PO TABS: 1.0000 mg | ORAL_TABLET | ORAL | Status: DC | PRN

## 2015-07-30 NOTE — Progress Notes (Signed)
[facility] Progress Note   ARLISA LECLERE  NLG:[id number]  DOB: May 15, 1946  DOA: 07/23/2015 PCP: Cathlean Cower, MD  Brief narrative: Amanda Gentry is a 69 y.o. 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 She developed acute renal failure in the hospital and pulmonary edema with respiratory failure which required BiPAP. She was managed by the critical care team and nephrology. Diuretics were not helpful in reducing fall volume overload. Patient declined hemodialysis. Palliative care was consult and patient and family chose to transition [facility] to [facility] only on 9/5.   Subjective: Moaning and complaining of pain in her left leg. States repeatedly that she wants to go home.  Assessment/Plan: Principal Problem:   Acute on chronic diastolic CHF (congestive heart failure) -Failed treatment with diuretics-now comfort care  Active Problems:  Acute renal failure -Occurring in setting of ACE inhibitor, NSAIDs and contrast -Suspected ATN versus contrast-induced nephropathy -Declined dialysis  Hyperkalemia - due to above renal failure  Chronic lymphedema with ulcers/decubitus ulcers -Pain is quite severe and she is currently on a fentanyl PCA  Severe anxiety/distress -When necessary Haldol and Ativan ordered-Zyprexa at bedtime  Acute thrombocytopenia  Morbid obesity Body mass index is 72.23 kg/(m^2).  Type 2 diabetes mellitus -discontinue insulin sliding scale  Hematuria -Renal ultrasound unrevealing  Code Status:     Code Status Orders        Start     Ordered   07/28/15 0946  Do not attempt resuscitation (DNR)   Continuous    Question Answer Comment  In the event of cardiac or respiratory ARREST Do not call a "code blue"   In the event of cardiac or respiratory ARREST Do not perform Intubation, CPR, defibrillation or ACLS    In the event of cardiac or respiratory ARREST Use medication by any route, position, wound care, and other measures to relive pain and suffering. May use oxygen, suction and manual treatment of airway obstruction as needed for comfort.      07/28/15 0945    Advance Directive Documentation        Most Recent Value   Type of Advance Directive  Healthcare Power of Attorney, Living will   Pre-existing out of facility DNR order (yellow form or pink MOST form)     "MOST" Form in Place?       Family Communication: Sister Disposition Plan: possible transfer to [facility] if she does not expire in the hospital DVT prophylaxis: none Consultants: PCCM, Nephrology, Palliative  Antibiotics: Anti-infectives    Start     Dose/Rate Route Frequency Ordered Stop   07/28/15 0800  cefTAZidime (FORTAZ) 2 g in dextrose 5 % 50 mL IVPB  Status:  Discontinued     2 g 100 mL/hr over 30 Minutes Intravenous Every 24 hours 07/27/15 0921 07/27/15 1103   07/26/15 2200  vancomycin (VANCOCIN) 500 mg in sodium chloride 0.9 % 100 mL IVPB  Status:  Discontinued     500 mg 100 mL/hr over 60 Minutes Intravenous  Once 07/26/15 2049 07/26/15 2103   07/26/15 2145  vancomycin (VANCOCIN) IVPB 1000 mg/200 mL premix     1,000 mg 200 mL/hr over 60 Minutes Intravenous  Once 07/26/15 2131 07/26/15 2245   07/25/15 2200  vancomycin (VANCOCIN) 1,750 mg in sodium chloride 0.9 % 500 mL IVPB  Status:  Discontinued     1,750 mg 250 mL/hr over 120 Minutes  Intravenous Every 24 hours 07/25/15 1853 07/26/15 1243   07/25/15 2000  cefTAZidime (FORTAZ) 2 g in dextrose 5 % 50 mL IVPB  Status:  Discontinued     2 g 100 mL/hr over 30 Minutes Intravenous Every 12 hours 07/25/15 1836 07/27/15 0921   07/25/15 1400  clindamycin (CLEOCIN) IVPB 600 mg  Status:  Discontinued     600 mg 100 mL/hr over 30 Minutes Intravenous 3 times per day 07/25/15 0921 07/25/15 1836   07/25/15 0600  vancomycin (VANCOCIN) 1,250 mg in sodium chloride 0.9 % 250 mL  IVPB  Status:  Discontinued     1,250 mg 166.7 mL/hr over 90 Minutes Intravenous Every 12 hours 07/24/15 1840 07/25/15 0829   07/24/15 2000  vancomycin (VANCOCIN) 2,000 mg in sodium chloride 0.9 % 500 mL IVPB     2,000 mg 250 mL/hr over 120 Minutes Intravenous  Once 07/24/15 1840 07/24/15 2143      Objective: Filed Weights   07/26/15 2345 07/27/15 0500 07/28/15 0338  Weight: 178.717 kg (394 lb) 178.7 kg (393 lb 15.4 oz) 179.171 kg (395 lb)    Intake/Output Summary (Last 24 hours) at 07/30/15 1717 Last data filed at 07/30/15 1438  Gross per 24 hour  Intake    214 ml  Output  10000 ml  Net  -9786 ml     Vitals Filed Vitals:   07/30/15 1027 07/30/15 1200 07/30/15 1600 07/30/15 1709  BP:      Pulse:      Temp:      TempSrc:      Resp: 14 17 16 14   Height:      Weight:      SpO2: 94% 92% 91% 93%    Exam:  General:  Pt is alert, not in acute distress  HEENT: No icterus, No thrush, oral mucosa moist  Cardiovascular: regular rate and rhythm, S1/S2 No murmur  Respiratory: clear to auscultation bilaterally   Abdomen: Soft, +Bowel sounds, non tender, non distended, no guarding  MSK: No LE edema, cyanosis or clubbing  Data Reviewed: Basic Metabolic Panel:  Recent Labs Lab 07/24/15 0825  07/25/15 1755 07/26/15 0340 07/26/15 1614 07/27/15 0835 07/28/15 0357  NA 139  < > 137 135 134* 136 135  K 5.5*  < > 6.0* 5.9* 6.7* 5.6* 5.4*  CL 112*  < > 106 106 107 108 107  CO2 24  < > 25 23 23  18* 21*  GLUCOSE 117*  < > 131* 140* 121* 118* 104*  BUN 33*  < > 39* 38* 42* 44* 50*  CREATININE 1.01*  < > 2.42* 2.72* 3.11* 3.55* 3.82*  CALCIUM 8.7*  < > 8.7* 8.4* 8.4* 8.4* 8.3*  MG 2.2  --   --   --   --   --   --   < > = values in this interval not displayed. Liver Function Tests:  Recent Labs Lab 07/24/15 0235 07/24/15 0825  AST 19 21  ALT 9* 11*  ALKPHOS 61 63  BILITOT 0.5 0.4  PROT 6.7 6.9  ALBUMIN 3.2* 3.3*   No results for input(s): LIPASE, AMYLASE in the  last 168 hours. No results for input(s): AMMONIA in the last 168 hours. CBC:  Recent Labs Lab 07/23/15 1801 07/24/15 0825 07/25/15 0518 07/26/15 0340 07/27/15 0835 07/28/15 0357  WBC 7.2 6.4 9.2 11.4* 8.1 7.7  NEUTROABS 6.2 5.5  --   --   --   --   HGB 11.5* 11.2* 11.2* 11.2* 10.9*  10.5*  HCT 37.7 37.3 39.1 38.7 36.4 34.3*  MCV 90.0 92.6 96.8 95.8 93.6 92.5  PLT 163 165 174 179 133* 130*   Cardiac Enzymes: No results for input(s): CKTOTAL, CKMB, CKMBINDEX, TROPONINI in the last 168 hours. BNP (last 3 results)  Recent Labs  07/23/15 1801 07/26/15 0340  BNP 222.4* 462.6*    ProBNP (last 3 results) No results for input(s): PROBNP in the last 8760 hours.  CBG:  Recent Labs Lab 07/27/15 1207 07/27/15 1617 07/27/15 2139 07/28/15 0738 07/28/15 1239  GLUCAP 114* 97 92 102* 104*    Recent Results (from the past 240 hour(s))  Culture, blood (routine x 2)     Status: None   Collection Time: 07/24/15  2:35 AM  Result Value Ref Range Status   Specimen Description BLOOD LEFT ARM  Final   Special Requests BOTTLES DRAWN AEROBIC AND ANAEROBIC [place]  Final   Culture   Final    NO GROWTH 5 DAYS Performed at [facility]    Report Status 07/29/2015 FINAL  Final  Culture, blood (routine x 2)     Status: None   Collection Time: 07/24/15  2:38 AM  Result Value Ref Range Status   Specimen Description BLOOD RIGHT HAND  Final   Special Requests BOTTLES DRAWN AEROBIC ONLY [place]  Final   Culture   Final    NO GROWTH 5 DAYS Performed at [facility]    Report Status 07/29/2015 FINAL  Final  Culture, blood (routine x 2)     Status: None   Collection Time: 07/25/15  9:30 AM  Result Value Ref Range Status   Specimen Description BLOOD RIGHT HAND  Final   Special Requests IN PEDIATRIC BOTTLE [place]  Final   Culture   Final    NO GROWTH 5 DAYS Performed at [facility]    Report Status 07/30/2015 FINAL  Final  Culture, blood (routine x 2)     Status: None    Collection Time: 07/25/15  9:33 AM  Result Value Ref Range Status   Specimen Description BLOOD LEFT ARM  Final   Special Requests BOTTLES DRAWN AEROBIC ONLY [facility]  Final   Culture   Final    NO GROWTH 5 DAYS Performed at [facility]    Report Status 07/30/2015 FINAL  Final  MRSA PCR Screening     Status: None   Collection Time: 07/25/15  6:46 PM  Result Value Ref Range Status   MRSA by PCR NEGATIVE NEGATIVE Final    Comment:        The GeneXpert MRSA Assay (FDA approved for NASAL specimens only), is one component of a comprehensive MRSA colonization surveillance program. It is not intended to diagnose MRSA infection nor to guide or monitor treatment for MRSA infections.   Urine culture     Status: None   Collection Time: 07/26/15 12:15 PM  Result Value Ref Range Status   Specimen Description URINE, CATHETERIZED  Final   Special Requests NONE  Final   Culture   Final    MULTIPLE SPECIES PRESENT, SUGGEST RECOLLECTION Performed at [facility]    Report Status 07/28/2015 FINAL  Final     Studies: No results found.  Scheduled Meds:  Scheduled Meds: . etomidate  0.3 mg/kg Intravenous Once  . fentaNYL   Intravenous 6 times per day  . OLANZapine  7.5 mg Oral QHS  . sodium chloride  3 mL Intravenous Q12H   Continuous Infusions:  Time spent on care of this patient: 42 min   [place], MD 07/30/2015, 5:17 PM  LOS: 7 days   [facility] Office  [phone] Pager - Text Page per [email or web address] If 7PM-7AM, please contact night-coverage [email or web address]

## 2015-07-30 NOTE — Progress Notes (Signed)
Fentanyl PCA modified per MD order,witnessed by Charisse Klinefelter. RN.

## 2015-07-30 NOTE — Progress Notes (Signed)
CSW continuing to follow.   CSW received referral for residential hospice placement.   CSW reviewed Palliative Medicine MD note, likely hospital death versus inpatient hospice.   CSW met with pt sister at bedside. Pt sister reported that pt husband will likely be at the hospital later this afternoon, but can be reached by phone. Pt sister was aware of recommendation for residential hospice and stated that she would provide pt daughter with CSW phone number to speak with CSW. List of residential hospice facilities given to pt sister.   CSW received phone call from pt daughter. Pt daughter expressed concern about pt leaving the hospital and anticipated that pt would be able to stay in the hospital for EOL care. CSW explained that it is the doctor's determination about rather a pt is stable to transfer out of the hospital, but the hospital starts the process of the disposition planning and then if pt not stable for transport then will not proceed with transition to residential hospice. Pt daughter expressed understanding. CSW discussed options with pt daughter and pt daughter states that [facility] would be the best option. Pt daughter will update pt husband and notify pt husband to contact this CSW regarding plan.   CSW made referral to [facility], Erling Conte.   CSW to follow up with pt husband once he arrives to the hospital or returns CSW phone call.  CSW to continue to follow to provide support and assist with pt disposition needs.   Alison Murray, MSW, [place] Work [phone]

## 2015-07-30 NOTE — Plan of Care (Signed)
Problem: Phase II Progression Outcomes Goal: Other Phase II Outcomes/Goals Outcome: Progressing Patient is comfortable. Fentanyl  IV boluses administered per order,

## 2015-07-30 NOTE — Consult Note (Signed)
[place] Liaison: Received request from [place] for family interest in [facility]. Chart reviewed and met with patient's spouse Ronalee Belts in conference room to answer questions and explain services. Made Ronalee Belts aware room is available for Mrs. Geving tomorrow and he accepted. Offered to wait until tomorrow to complete paper work but Ronalee Belts preferred to do paper work this afternoon so we did. Dr. Tomasa Hosteller to assume care per [facility] request.   Please fax discharge summary to [phone].  RN please call report to [phone].  Thank you.  Erling Conte, [place]

## 2015-07-30 NOTE — Progress Notes (Signed)
Daily Progress Note   Patient Name: Amanda Gentry       Date: 07/30/2015 DOB: 04/21/1946  Age: 69 y.o. MRN#: [id number] Attending Physician: Debbe Odea, MD Primary Care Physician: Cathlean Cower, MD Admit Date: 07/23/2015  Reason for Consultation/Follow-up: Establishing goals of care and Pain control  Subjective: Patient has ongoing pain and anxiety. Discussed with sister in the room. Sister would like for the patient to remain in the hospital for at least another 24 hours.  Patient likely appears to have existential distress. She is moaning, does verbalize meaningfully. She reports generalized pain.   Length of Stay: 7 days  Current Medications: Scheduled Meds:  . etomidate  0.3 mg/kg Intravenous Once  . fentaNYL   Intravenous 6 times per day  . OLANZapine  7.5 mg Oral QHS  . sodium chloride  3 mL Intravenous Q12H    Continuous Infusions:    PRN Meds: sodium chloride, diphenhydrAMINE **OR** diphenhydrAMINE, fentaNYL (SUBLIMAZE) injection, glycopyrrolate **OR** glycopyrrolate **OR** glycopyrrolate, haloperidol **OR** haloperidol **OR** haloperidol lactate, LORazepam **OR** LORazepam **OR** LORazepam, ondansetron (ZOFRAN) IV, sodium chloride  Palliative Performance Scale: 20%     Vital Signs: BP 132/57 mmHg  Pulse 85  Temp(Src) 97.3 F (36.3 C) (Oral)  Resp 17  Ht 5\' 2"  (1.575 m)  Wt 179.171 kg (395 lb)  BMI 72.23 kg/m2  SpO2 92% SpO2: SpO2: 92 % O2 Device: O2 Device: Not Delivered, Nasal Cannula O2 Flow Rate: O2 Flow Rate (L/min): 4 L/min  Intake/output summary:   Intake/Output Summary (Last 24 hours) at 07/30/15 1250 Last data filed at 07/30/15 1042  Gross per 24 hour  Intake      3 ml  Output  10400 ml  Net -10397 ml   LBM:   Baseline Weight: Weight: (!) 166.47 kg (367 lb) Most recent weight: Weight: (!) 179.171 kg (395 lb)  Physical Exam:  General: Pt is resting in bed with eyes closed, but responds appropriately in mild to moderate distress.    Cardiovascular: Regular rate and rhythm, no rubs, no gallops  Respiratory: Distant hear sounds, diminished air movement at bases   Abdomen: Soft, non tender, non distended, bowel sounds present, no guarding Extremities: bilateral venous stasis changes, significant lymphedema changes.              Additional Data Reviewed: Recent Labs     07/28/15  0357  WBC  7.7  HGB  10.5*  PLT  130*  NA  135  BUN  50*  CREATININE  3.82*     Problem List:  Patient Active Problem List   Diagnosis Date Noted  . SOB (shortness of breath)   . Palliative care encounter   . Acute respiratory failure with hypercapnia 07/26/2015  . Creatinine elevation   . Hypoxia   . Pulmonary edema   . 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 Decubitus ulcer 07/24/2015  . Chronic venous insufficiency 07/23/2015  . Decubitus ulcer of buttock 07/23/2015  . Acute on chronic diastolic CHF (congestive heart failure) 07/23/2015  . Major depressive disorder, single episode, moderate 01/29/2015  . Acquired lymphedema of leg 01/29/2015  . Leg wound, right 01/29/2015  . Gait disorder 08/01/2012  . Urinary frequency 01/28/2012  . Left flank tenderness 01/28/2012  . Degenerative arthritis of right knee 01/28/2012  . Fatigue 01/28/2012  . Preventative health care 07/30/2011  . VITAMIN D DEFICIENCY 03/31/2010  . KNEE PAIN, RIGHT, CHRONIC 03/14/2008  . HYPERTHYROIDISM 09/23/2007  . Diabetes 09/23/2007  . Hyperlipidemia 09/23/2007  . Morbid  obesity 09/23/2007  . ANXIETY 09/23/2007  . DEPRESSION 09/23/2007  . Essential hypertension 09/23/2007  . AORTIC INSUFFICIENCY 09/23/2007  . GERD 09/23/2007  . OSTEOARTHRITIS 09/23/2007  . LOW BACK PAIN 09/23/2007  . INSOMNIA 09/23/2007     Palliative Care Assessment & Plan    Code Status:  DNR  Goals of Care:  Patient is Comfort Care only. Continue working on her symptom management. Discussed with sister about options for residential hospice, family would like for the patient  to be in the hospital, at least for another 24 hours. We'll continue this conversation tomorrow.  Symptom Management:  Pain:   Dilaudid PCA basal and bolus dosages increased. Discussed with bedside RN   Anxiety: increase Ativan and Zyprexa use  Palliative Prophylaxis: Suppository as needed   Palliative Prophylaxis:  Dulcolax as needed  Psycho-social/Spiritual:  Desire for further Chaplaincy support:no   Prognosis: Hours - Days Discharge Planning: likely hospital death versus inpatient hospice.    Care plan was discussed with patient and her husband  Thank you for allowing the Palliative Medicine Team to assist in the care of this patient.   Time In: 1230 Time Out: 1255 Total Time 25 Prolonged Time Billed no    Greater than 50%  of this time was spent counseling and coordinating care related to the above assessment and plan.  [place], MD  07/30/2015, 12:50 PM  Please contact Palliative Medicine Team phone at [phone] for questions and concerns.

## 2015-07-30 NOTE — Care Management Important Message (Signed)
Important Message  Patient Details  Name: TIMAYA BOJARSKI MRN: [id number] Date of Birth: May 23, 1946   Medicare Important Message Given:  Yes-second notification given    Camillo Flaming 07/30/2015, 12:20 [place] Message  Patient Details  Name: KIRANDEEP FARISS MRN: [id number] Date of Birth: November 03, 1946   Medicare Important Message Given:  Yes-second notification given    Camillo Flaming 07/30/2015, 12:19 PM

## 2015-07-31 MED ORDER — HALOPERIDOL LACTATE 2 MG/ML PO CONC: 0.5000 mg | ORAL | Status: AC | PRN

## 2015-07-31 MED ORDER — SODIUM CHLORIDE 0.9 % IV SOLN: 50.0000 ug/h | INTRAVENOUS | Status: AC

## 2015-07-31 MED ORDER — ACETAMINOPHEN 500 MG PO TABS: 1000.0000 mg | ORAL_TABLET | Freq: Four times a day (QID) | ORAL | Status: AC | PRN

## 2015-07-31 MED ORDER — OLANZAPINE 7.5 MG PO TABS: 7.5000 mg | ORAL_TABLET | Freq: Every day | ORAL | Status: AC

## 2015-07-31 MED ORDER — LORAZEPAM 2 MG/ML PO CONC: 1.0000 mg | ORAL | Status: AC | PRN

## 2015-07-31 MED ORDER — GLYCOPYRROLATE 1 MG PO TABS: 1.0000 mg | ORAL_TABLET | ORAL | Status: AC | PRN

## 2015-07-31 NOTE — Progress Notes (Signed)
CSW received notification that pt has bed available at [facility] for today.  CSW notified MD.   CSW facilitated pt discharge needs including contacting facility, confirming [facility] liaison, Erling Conte faxed pt discharge information, discussing with pt and pt family at bedside, providing RN phone number to call report, and arranging ambulance transport for pt to [facility].   Support provided to pt family at bedside.   No further social work needs identified at this time.  CSW signing off at this time.  Alison Murray, MSW, [place] Work [phone]

## 2015-07-31 NOTE — Discharge Summary (Signed)
Physician Discharge Summary  Amanda Gentry EUM:[id number] DOB: 04-18-1946 DOA: 07/23/2015  PCP: Cathlean Cower, MD  Admit date: 07/23/2015 Discharge date: 07/31/2015  Time spent: 50 minutes  Recommendations for Outpatient Follow-up:  1. She will be transitioning to [facility]  Discharge Condition: Stable Diet recommendation: Diet as tolerated  Discharge Diagnoses:  Principal Problem:   Acute on chronic diastolic CHF (congestive heart failure) Active Problems:   Diabetes   Morbid obesity   Essential hypertension   GERD   KNEE PAIN, RIGHT, CHRONIC   Acquired lymphedema of leg   Chronic venous insufficiency   Sarcoma   Decubitus ulcer   Acute respiratory failure with hypercapnia   Creatinine elevation   Hypoxia   Pulmonary edema   Generalized abdominal pain   Adjustment disorder with anxious mood   History of present illness:  Amanda Gentry is a 69 y.o. 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 She developed acute renal failure in the hospital and pulmonary edema with respiratory failure which required BiPAP. She was managed by the critical care team and nephrology. Diuretics were not helpful in reducing fall volume overload. Patient declined hemodialysis. Palliative care was consulted and patient and family chose to transition her to comfort care on 9/5.  Hospital Course:  Principal Problem:  Acute on chronic diastolic CHF (congestive heart failure) -Failed treatment with diuretics-now comfort care  Active Problems:  Acute renal failure -Occurring in setting of ACE inhibitor, NSAIDs and contrast -Suspected ATN versus contrast-induced nephropathy -Declined dialysis  Hyperkalemia - due to above renal failure  Chronic lymphedema with ulcers/decubitus ulcers -Pain is quite severe and she is currently on a fentanyl PCA-will be even a  bolus dose of fentanyl prior to transfer to [facility] after which she can be placed on a fentanyl infusion  Severe anxiety/distress -When necessary Haldol and Ativan ordered-Zyprexa at bedtime  Acute thrombocytopenia  Morbid obesity Body mass index is 72.23 kg/(m^2).  Type 2 diabetes mellitus -discontinue insulin sliding scale  Hematuria -Renal ultrasound unrevealing  Consultants: PCCM, Nephrology, Palliative  Discharge Exam: Filed Weights   07/26/15 2345 07/27/15 0500 07/28/15 0338  Weight: 178.717 kg (394 lb) 178.7 kg (393 lb 15.4 oz) 179.171 kg (395 lb)   Filed Vitals:   07/31/15 0744  BP:   Pulse:   Temp:   Resp: 16    General: Sleeping, no distress Cardiovascular: RRR, no murmurs  Respiratory: clear to auscultation bilaterally GI: soft, non-tender, non-distended, bowel sound positive Extremities: Skin changes of chronic bilateral lymphedema with an extensive amount of nodules on both legs  Discharge Instructions You were cared for by a hospitalist during your hospital stay. If you have any questions about your discharge medications or the care you received while you were in the hospital after you are discharged, you can call the unit and asked to speak with the hospitalist on call if the hospitalist that took care of you is not available. Once you are discharged, your primary care physician will handle any further medical issues. Please note that NO REFILLS for any discharge medications will be authorized once you are discharged, as it is imperative that you return to your primary care physician (or establish a relationship with a primary care physician if you do not have one) for your aftercare needs so that they can reassess your need for medications and monitor your lab values.  Discharge Instructions  Discharge instructions    Complete by:  As directed   Diet as tolerated            Medication List    STOP taking these medications        ALPRAZolam  0.5 MG tablet  Commonly known as:  XANAX     amLODipine 10 MG tablet  Commonly known as:  NORVASC     aspirin 81 MG tablet     benazepril 40 MG tablet  Commonly known as:  LOTENSIN     CENTRUM SILVER PO     diclofenac 75 MG EC tablet  Commonly known as:  VOLTAREN     escitalopram 10 MG tablet  Commonly known as:  LEXAPRO     hydrochlorothiazide 25 MG tablet  Commonly known as:  HYDRODIURIL     levofloxacin 250 MG tablet  Commonly known as:  LEVAQUIN     levothyroxine 100 MCG tablet  Commonly known as:  SYNTHROID, LEVOTHROID     metFORMIN 500 MG tablet  Commonly known as:  GLUCOPHAGE     omeprazole 20 MG capsule  Commonly known as:  PRILOSEC     pioglitazone 45 MG tablet  Commonly known as:  ACTOS     predniSONE 10 MG tablet  Commonly known as:  DELTASONE     rosuvastatin 20 MG tablet  Commonly known as:  CRESTOR     traMADol 50 MG tablet  Commonly known as:  ULTRAM      TAKE these medications        acetaminophen 500 MG tablet  Commonly known as:  TYLENOL  Take 2 tablets (1,000 mg total) by mouth every 6 (six) hours as needed for mild pain, fever or headache.     fentaNYL 2,500 mcg in sodium chloride 0.9 % 200 mL  Inject 50 mcg/hr into the vein continuous.     glycopyrrolate 1 MG tablet  Commonly known as:  ROBINUL  Take 1 tablet (1 mg total) by mouth every 4 (four) hours as needed (excessive secretions).     haloperidol 2 MG/ML solution  Commonly known as:  HALDOL  Place 0.3 mLs (0.6 mg total) under the tongue every 4 (four) hours as needed for agitation (or delirium).     LORazepam 2 MG/ML concentrated solution  Commonly known as:  ATIVAN  Place 0.5-1 mLs (1-2 mg total) under the tongue every 4 (four) hours as needed for anxiety.     OLANZapine 7.5 MG tablet  Commonly known as:  ZYPREXA  Take 1 tablet (7.5 mg total) by mouth at bedtime.       Allergies  Allergen Reactions  . Lipitor [Atorvastatin]     Generic capsule causes bad taste in  mouth      The results of significant diagnostics from this hospitalization (including imaging, microbiology, ancillary and laboratory) are listed below for reference.    Significant Diagnostic Studies: Dg Chest 2 View  07/23/2015   CLINICAL DATA:  Shortness of breath for 2 days, lymphedema, hypertension  EXAM: CHEST  2 VIEW  COMPARISON:  01/28/2012  FINDINGS: Moderate enlargement of the cardiac silhouette is noted with central vascular congestion, patchy perihilar opacities, and underlying interstitial prominence. Small pleural effusions are noted. No acute osseous finding.  IMPRESSION: Cardiomegaly with probable early alveolar edema superimposed on interstitial edema although other alveolar filling processes could appear similar.   Electronically Signed   By: Conchita Paris M.D.   On: 07/23/2015 18:38   Ct Angio Chest Pe  W/cm &/or Wo Cm  07/24/2015   CLINICAL DATA:  Worsening bilateral lower extremity edema. Hypoxia and shortness of breath, acute onset. Initial encounter.  EXAM: CT ANGIOGRAPHY CHEST WITH CONTRAST  TECHNIQUE: Multidetector CT imaging of the chest was performed using the standard protocol during bolus administration of intravenous contrast. Multiplanar CT image reconstructions and MIPs were obtained to evaluate the vascular anatomy.  CONTRAST:  159mL OMNIPAQUE IOHEXOL 350 MG/ML SOLN  COMPARISON:  Chest radiograph performed earlier today at 6:23 p.m.  FINDINGS: There is no evidence of significant pulmonary embolus. Evaluation for pulmonary embolus is suboptimal in areas of airspace consolidation.  Trace bilateral pleural effusions are noted. Patchy bilateral central airspace opacities are seen, with interstitial prominence and vague ground-glass airspace opacities bilaterally, likely reflecting pulmonary edema. There is no evidence of pneumothorax.  The mediastinum is mildly enlarged. A 1.6 cm periaortic node is seen. Remaining visualized mediastinal nodes are borderline normal in size.  The great vessels are grossly unremarkable in appearance. Incidental note is made of a direct origin of the left vertebral artery from the aortic arch. No pericardial effusion is identified. No axillary lymphadenopathy is seen. The thyroid gland is unremarkable in appearance.  The visualized portions of the liver and spleen are unremarkable. A small amount of ascites is noted about the liver and stomach.  No acute osseous abnormalities are seen. There is loss of the joint space at the left glenohumeral joint, with associated sclerotic change.  Review of the MIP images confirms the above findings.  IMPRESSION: 1. No evidence of significant pulmonary embolus. 2. Trace bilateral pleural effusions noted. Patchy bilateral central airspace opacities, with interstitial prominence and vague ground-glass airspace opacities bilaterally, likely reflecting pulmonary edema. 3. Mild cardiomegaly. 4. 1.6 cm periaortic node, of uncertain significance. 5. Small amount of ascites noted about the liver and stomach. 6. Degenerative change at the left glenohumeral joint.   Electronically Signed   By: Garald Balding M.D.   On: 07/24/2015 01:05   US Renal  07/25/2015   CLINICAL DATA:  Elevated creatinine, history diabetes mellitus, hypertension  EXAM: RENAL / URINARY TRACT ULTRASOUND COMPLETE  COMPARISON:  None.  FINDINGS: Right Kidney:  Length: 10.0 cm. Normal cortical thickness and echogenicity. No mass, hydronephrosis or shadowing calcification.  Left Kidney:  Length: 9.7 cm. Suboptimal visualization of LEFT kidney secondary to body habitus. Cortical thinning identified. No gross evidence of mass or hydronephrosis identified on limited assessment.  Bladder:  Decompressed by Foley catheter, not evaluated.  IMPRESSION:  Poor visualization of LEFT kidney secondary to body habitus, with no gross mass or hydronephrosis identified.  Unremarkable RIGHT kidney.   Electronically Signed   By: Lavonia Dana M.D.   On: 07/25/2015 16:16   Dg  Chest Port 1 View  07/27/2015   CLINICAL DATA:  Respiratory failure, shortness of breath and acute renal failure.  EXAM: PORTABLE CHEST - 1 VIEW  COMPARISON:  07/25/2015 and prior exams  FINDINGS: Cardiomegaly and interstitial/airspace opacities bilaterally are unchanged.  There is no evidence of pneumothorax.  No large pleural effusion is identified.  Little interval change since prior study noted.  IMPRESSION: Little significant change with continued interstitial and airspace opacities bilaterally which may represent edema or infection.   Electronically Signed   By: Margarette Canada M.D.   On: 07/27/2015 09:31   Dg Chest Port 1 View  07/25/2015   CLINICAL DATA:  Lethargy, decreased responsiveness  EXAM: PORTABLE CHEST - 1 VIEW  COMPARISON:  CT 07/24/2015 and chest radiograph  07/23/2015  FINDINGS: Patchy hazy perihilar airspace opacities are slightly increased since previously. Cardiomegaly reidentified. Lungs are hypoaerated with crowding of the bronchovascular markings. Trace pleural effusions.  IMPRESSION: Increased patchy pulmonary parenchymal airspace opacities which are nonspecific but could indicate worsening edema in the setting of cardiomegaly. Infection or ARDS could appear similar.  Trace pleural effusions.   Electronically Signed   By: Conchita Paris M.D.   On: 07/25/2015 17:53    Microbiology: Recent Results (from the past 240 hour(s))  Culture, blood (routine x 2)     Status: None   Collection Time: 07/24/15  2:35 AM  Result Value Ref Range Status   Specimen Description BLOOD LEFT ARM  Final   Special Requests BOTTLES DRAWN AEROBIC AND ANAEROBIC [place]  Final   Culture   Final    NO GROWTH 5 DAYS Performed at [facility]    Report Status 07/29/2015 FINAL  Final  Culture, blood (routine x 2)     Status: None   Collection Time: 07/24/15  2:38 AM  Result Value Ref Range Status   Specimen Description BLOOD RIGHT HAND  Final   Special Requests BOTTLES DRAWN AEROBIC ONLY [place]  Final    Culture   Final    NO GROWTH 5 DAYS Performed at [facility]    Report Status 07/29/2015 FINAL  Final  Culture, blood (routine x 2)     Status: None   Collection Time: 07/25/15  9:30 AM  Result Value Ref Range Status   Specimen Description BLOOD RIGHT HAND  Final   Special Requests IN PEDIATRIC BOTTLE [place]  Final   Culture   Final    NO GROWTH 5 DAYS Performed at [facility]    Report Status 07/30/2015 FINAL  Final  Culture, blood (routine x 2)     Status: None   Collection Time: 07/25/15  9:33 AM  Result Value Ref Range Status   Specimen Description BLOOD LEFT ARM  Final   Special Requests BOTTLES DRAWN AEROBIC ONLY [facility]  Final   Culture   Final    NO GROWTH 5 DAYS Performed at [facility]    Report Status 07/30/2015 FINAL  Final  MRSA PCR Screening     Status: None   Collection Time: 07/25/15  6:46 PM  Result Value Ref Range Status   MRSA by PCR NEGATIVE NEGATIVE Final    Comment:        The GeneXpert MRSA Assay (FDA approved for NASAL specimens only), is one component of a comprehensive MRSA colonization surveillance program. It is not intended to diagnose MRSA infection nor to guide or monitor treatment for MRSA infections.   Urine culture     Status: None   Collection Time: 07/26/15 12:15 PM  Result Value Ref Range Status   Specimen Description URINE, CATHETERIZED  Final   Special Requests NONE  Final   Culture   Final    MULTIPLE SPECIES PRESENT, SUGGEST RECOLLECTION Performed at [facility]    Report Status 07/28/2015 FINAL  Final     Labs: Basic Metabolic Panel:  Recent Labs Lab 07/25/15 1755 07/26/15 0340 07/26/15 1614 07/27/15 0835 07/28/15 0357  NA 137 135 134* 136 135  K 6.0* 5.9* 6.7* 5.6* 5.4*  CL 106 106 107 108 107  CO2 25 23 23  18* 21*  GLUCOSE 131* 140* 121* 118* 104*  BUN 39* 38* 42* 44* 50*  CREATININE 2.42* 2.72* 3.11* 3.55* 3.82*  CALCIUM 8.7* 8.4* 8.4*  8.4* 8.3*   Liver Function Tests: No  results for input(s): AST, ALT, ALKPHOS, BILITOT, PROT, ALBUMIN in the last 168 hours. No results for input(s): LIPASE, AMYLASE in the last 168 hours. No results for input(s): AMMONIA in the last 168 hours. CBC:  Recent Labs Lab 07/25/15 0518 07/26/15 0340 07/27/15 0835 07/28/15 0357  WBC 9.2 11.4* 8.1 7.7  HGB 11.2* 11.2* 10.9* 10.5*  HCT 39.1 38.7 36.4 34.3*  MCV 96.8 95.8 93.6 92.5  PLT 174 179 133* 130*   Cardiac Enzymes: No results for input(s): CKTOTAL, CKMB, CKMBINDEX, TROPONINI in the last 168 hours. BNP: BNP (last 3 results)  Recent Labs  07/23/15 1801 07/26/15 0340  BNP 222.4* 462.6*    ProBNP (last 3 results) No results for input(s): PROBNP in the last 8760 hours.  CBG:  Recent Labs Lab 07/27/15 1207 07/27/15 1617 07/27/15 2139 07/28/15 0738 07/28/15 1239  GLUCAP 114* 97 92 102* 104*       SignedDebbe Odea, MD [facility] 07/31/2015, 8:42 AM

## 2015-07-31 NOTE — Progress Notes (Signed)
Report called to [facility].  Family at bedside.  Transport called and arranged.  Danton Clap, RN

## 2015-07-31 NOTE — Progress Notes (Signed)
Wasted 20 cc of Fentanyl PCA.  Verified by Aldean Baker RN  Danton Clap, RN

## 2015-08-01 ENCOUNTER — Telehealth: Payer: Self-pay | Admitting: *Deleted

## 2015-08-01 NOTE — Telephone Encounter (Signed)
Pt was on tcm list d/c on 9/8 to [facility] place no aapt made...Johny Chess

## 2015-08-13 ENCOUNTER — Encounter (HOSPITAL_BASED_OUTPATIENT_CLINIC_OR_DEPARTMENT_OTHER): Payer: Medicare (Managed Care) | Attending: Surgery

## 2015-08-23 DEATH — deceased

## 2015-10-09 ENCOUNTER — Other Ambulatory Visit: Payer: Self-pay | Admitting: Internal Medicine

## 2015-10-17 IMAGING — US US RENAL
1 series · 14 of 25 positions shown · non-contrast
Comparison: None.

CLINICAL DATA: Elevated creatinine, history diabetes mellitus,
hypertension

EXAM:
RENAL / URINARY TRACT ULTRASOUND COMPLETE

[Series 1: us renal · 0.24mm/px · 14 of 25 slices shown]
[im 1/25]
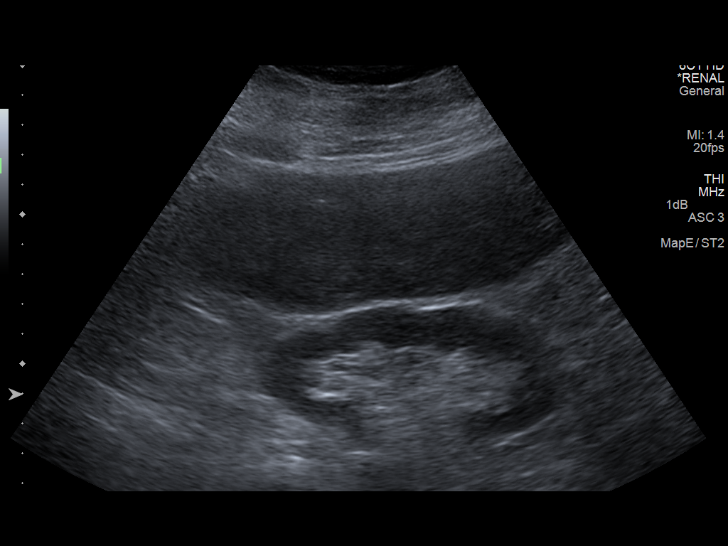
[im 3/25]
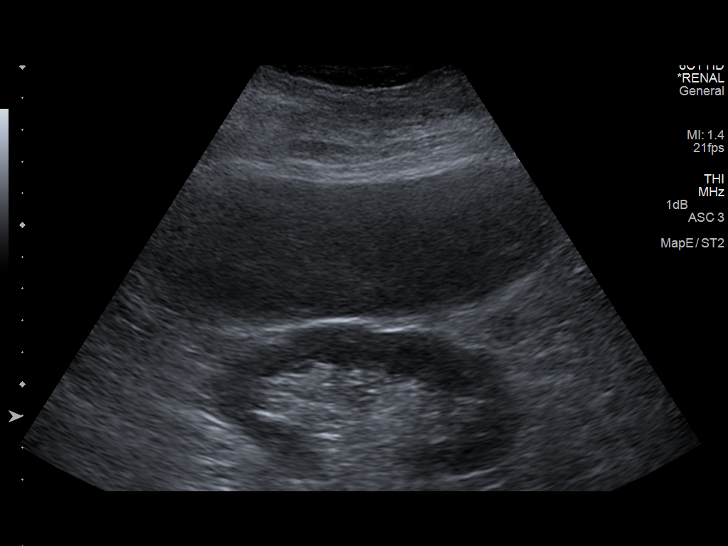
[im 5/25]
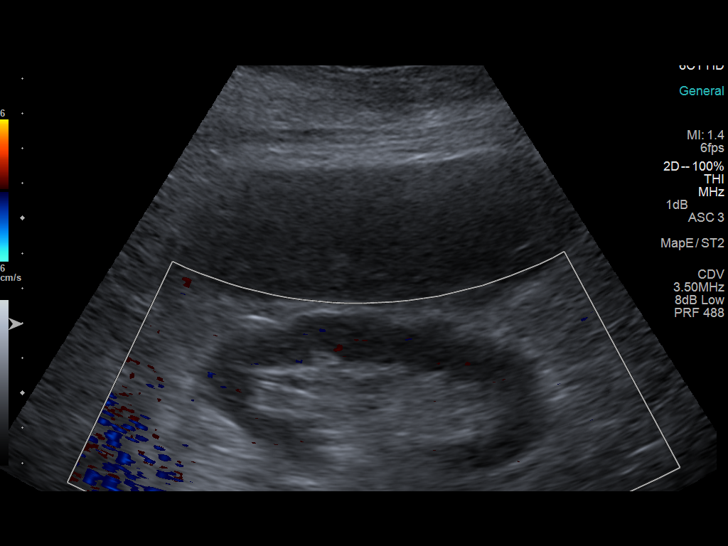
[im 7/25]
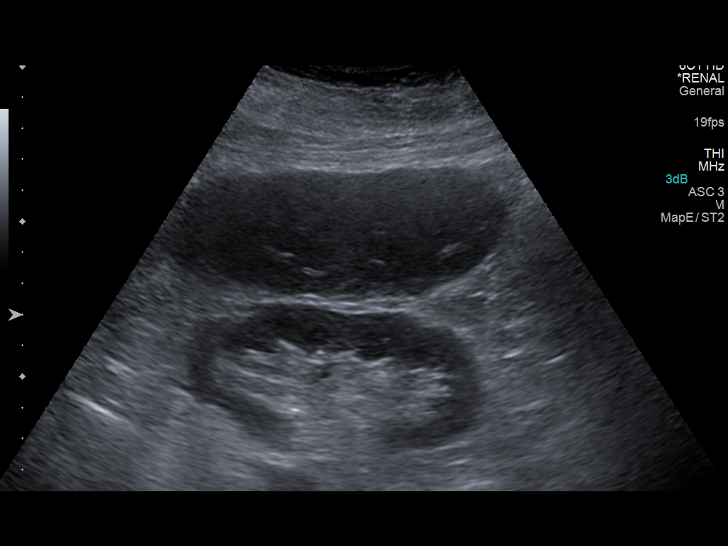
[im 9/25]
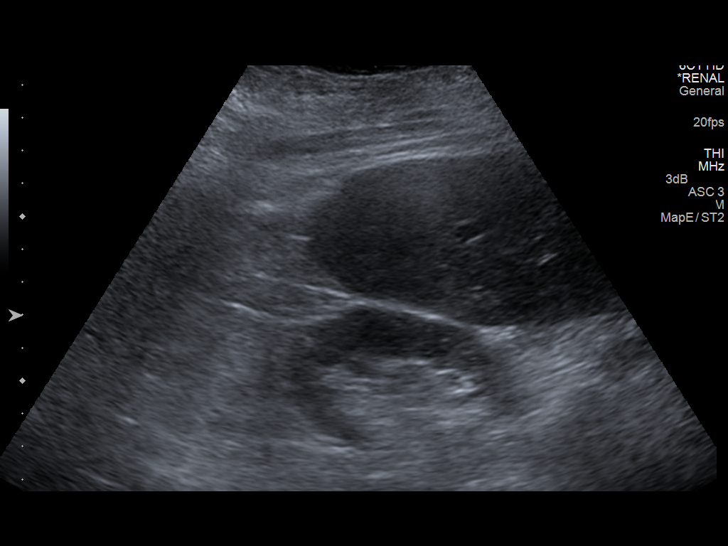
[im 10/25]
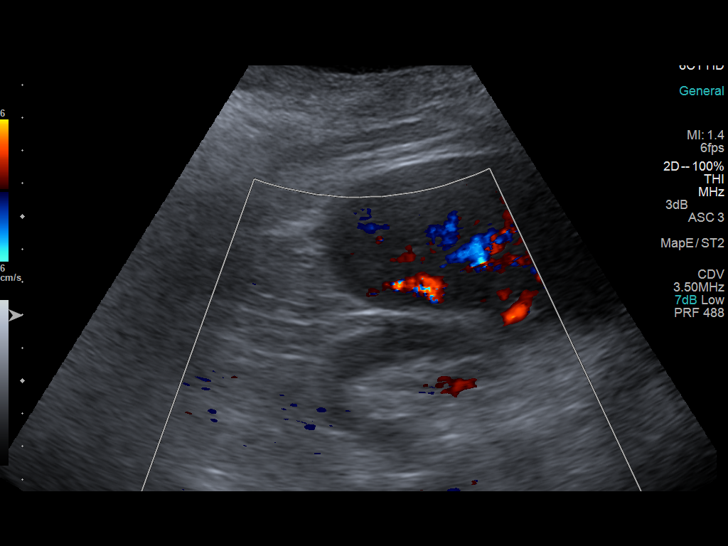
[im 12/25]
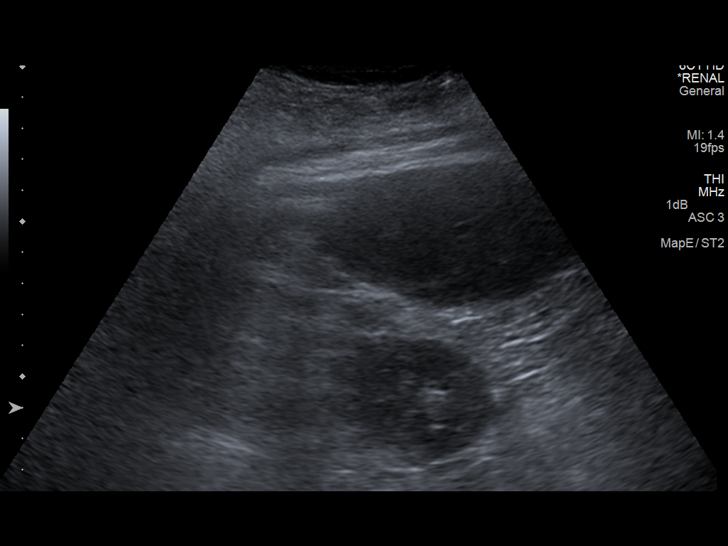
[im 14/25]
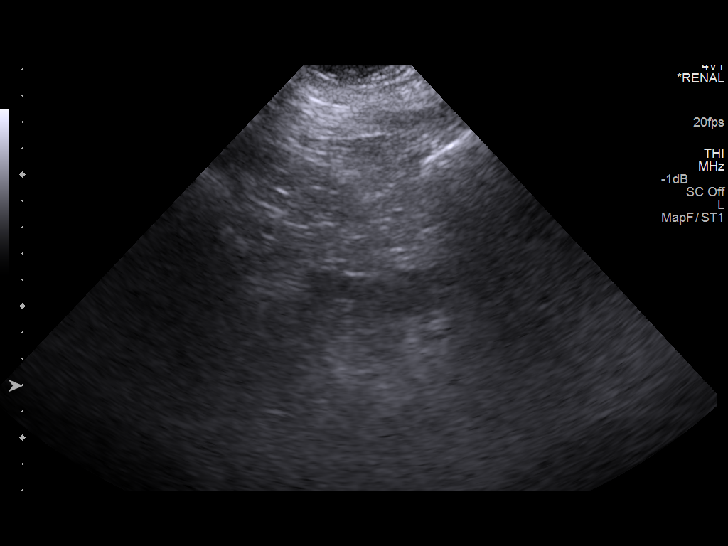
[im 16/25]
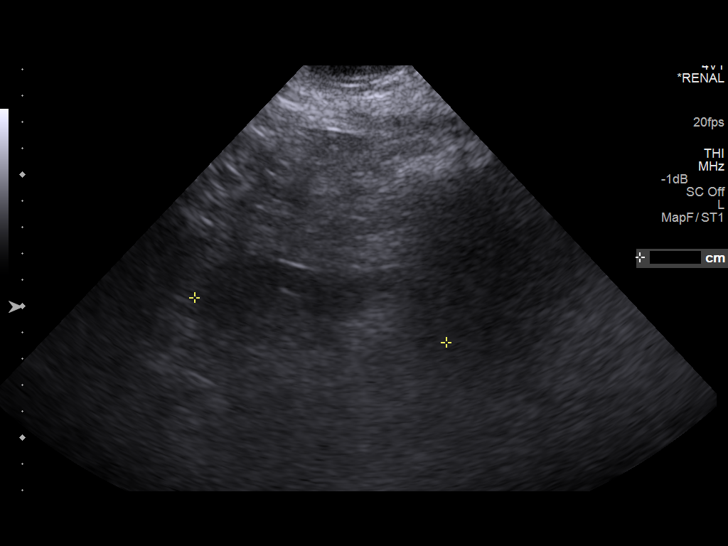
[im 17/25]
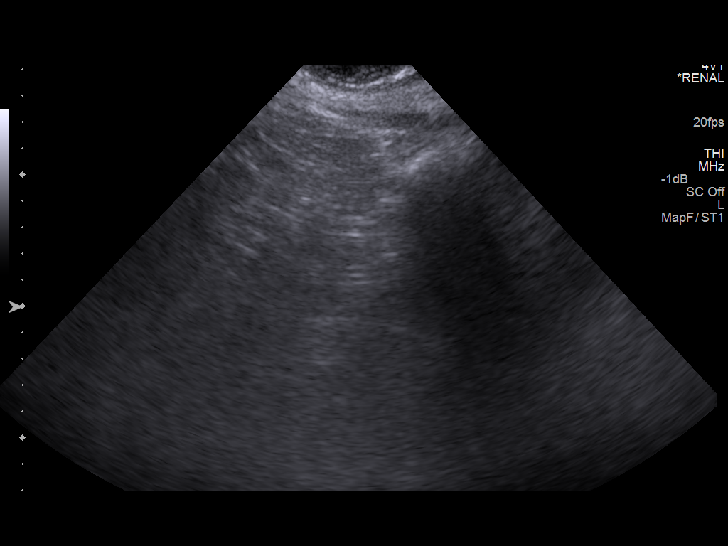
[im 19/25]
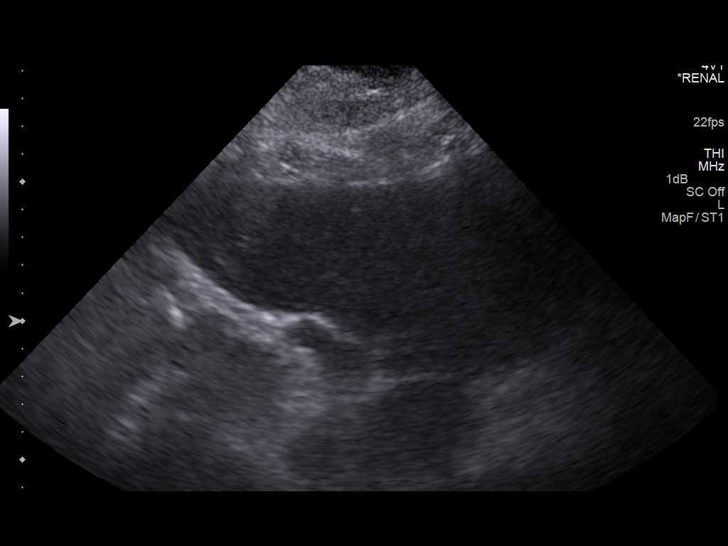
[im 21/25]
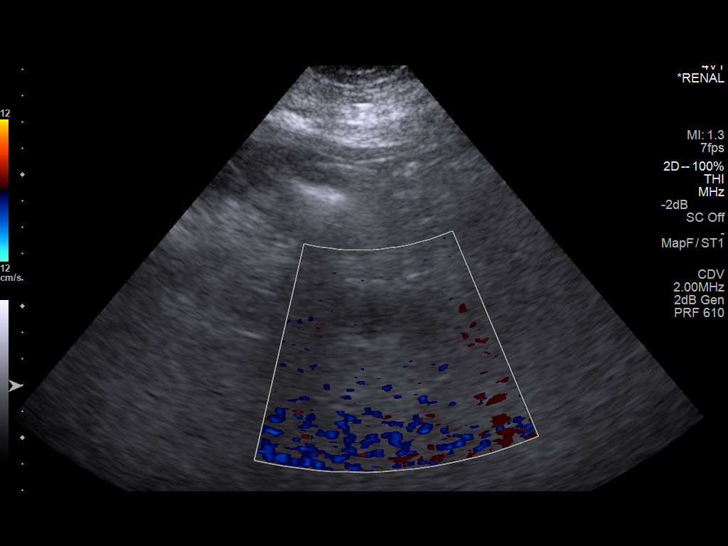
[im 23/25]
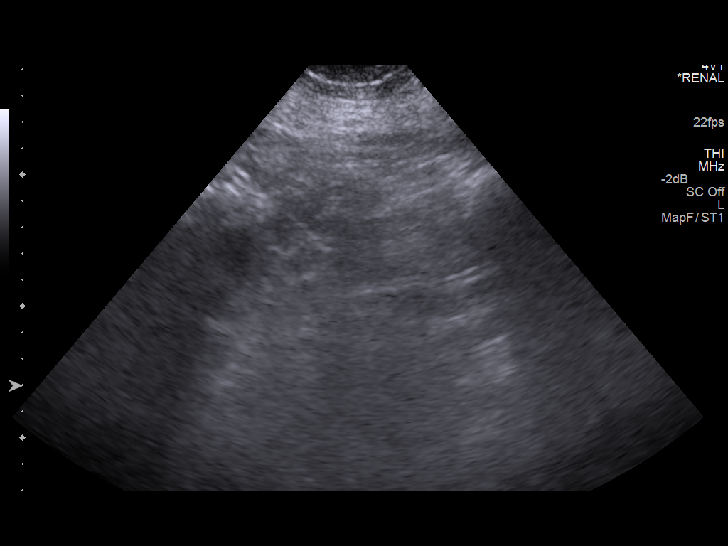
[im 25/25]
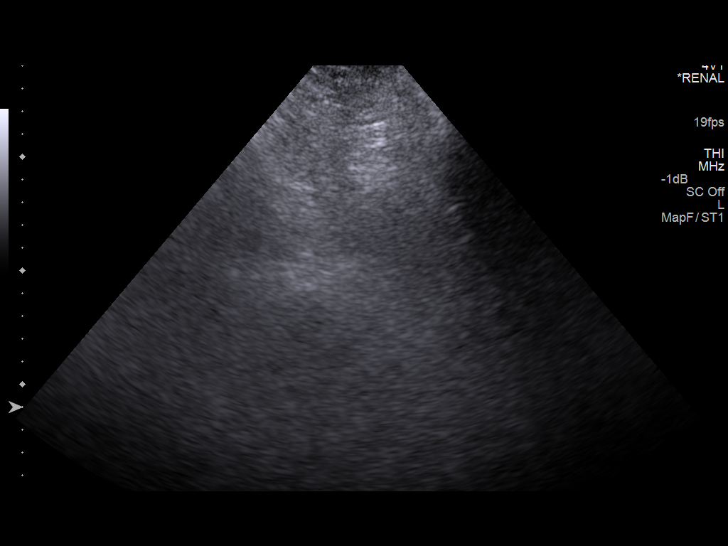

[14 of 25 positions shown; findings below may reference images not displayed]

FINDINGS: Right Kidney:

Length: 10.0 cm. Normal cortical thickness and echogenicity. No
mass, hydronephrosis or shadowing calcification.

Left Kidney:

Length: 9.7 cm. Suboptimal visualization of LEFT kidney secondary to
body habitus. Cortical thinning identified. No gross evidence of
mass or hydronephrosis identified on limited assessment.

Bladder:

Decompressed by Foley catheter, not evaluated.
IMPRESSION: Poor visualization of LEFT kidney secondary to body habitus, with no
gross mass or hydronephrosis identified.

Unremarkable RIGHT kidney.

## 2015-12-26 IMAGING — CR DG CHEST 1V PORT
1 series · 1 of 1 positions shown · non-contrast
Comparison: CT 07/24/2015 and chest radiograph 07/23/2015

CLINICAL DATA: Lethargy, decreased responsiveness

EXAM:
PORTABLE CHEST - 1 VIEW

[AP]
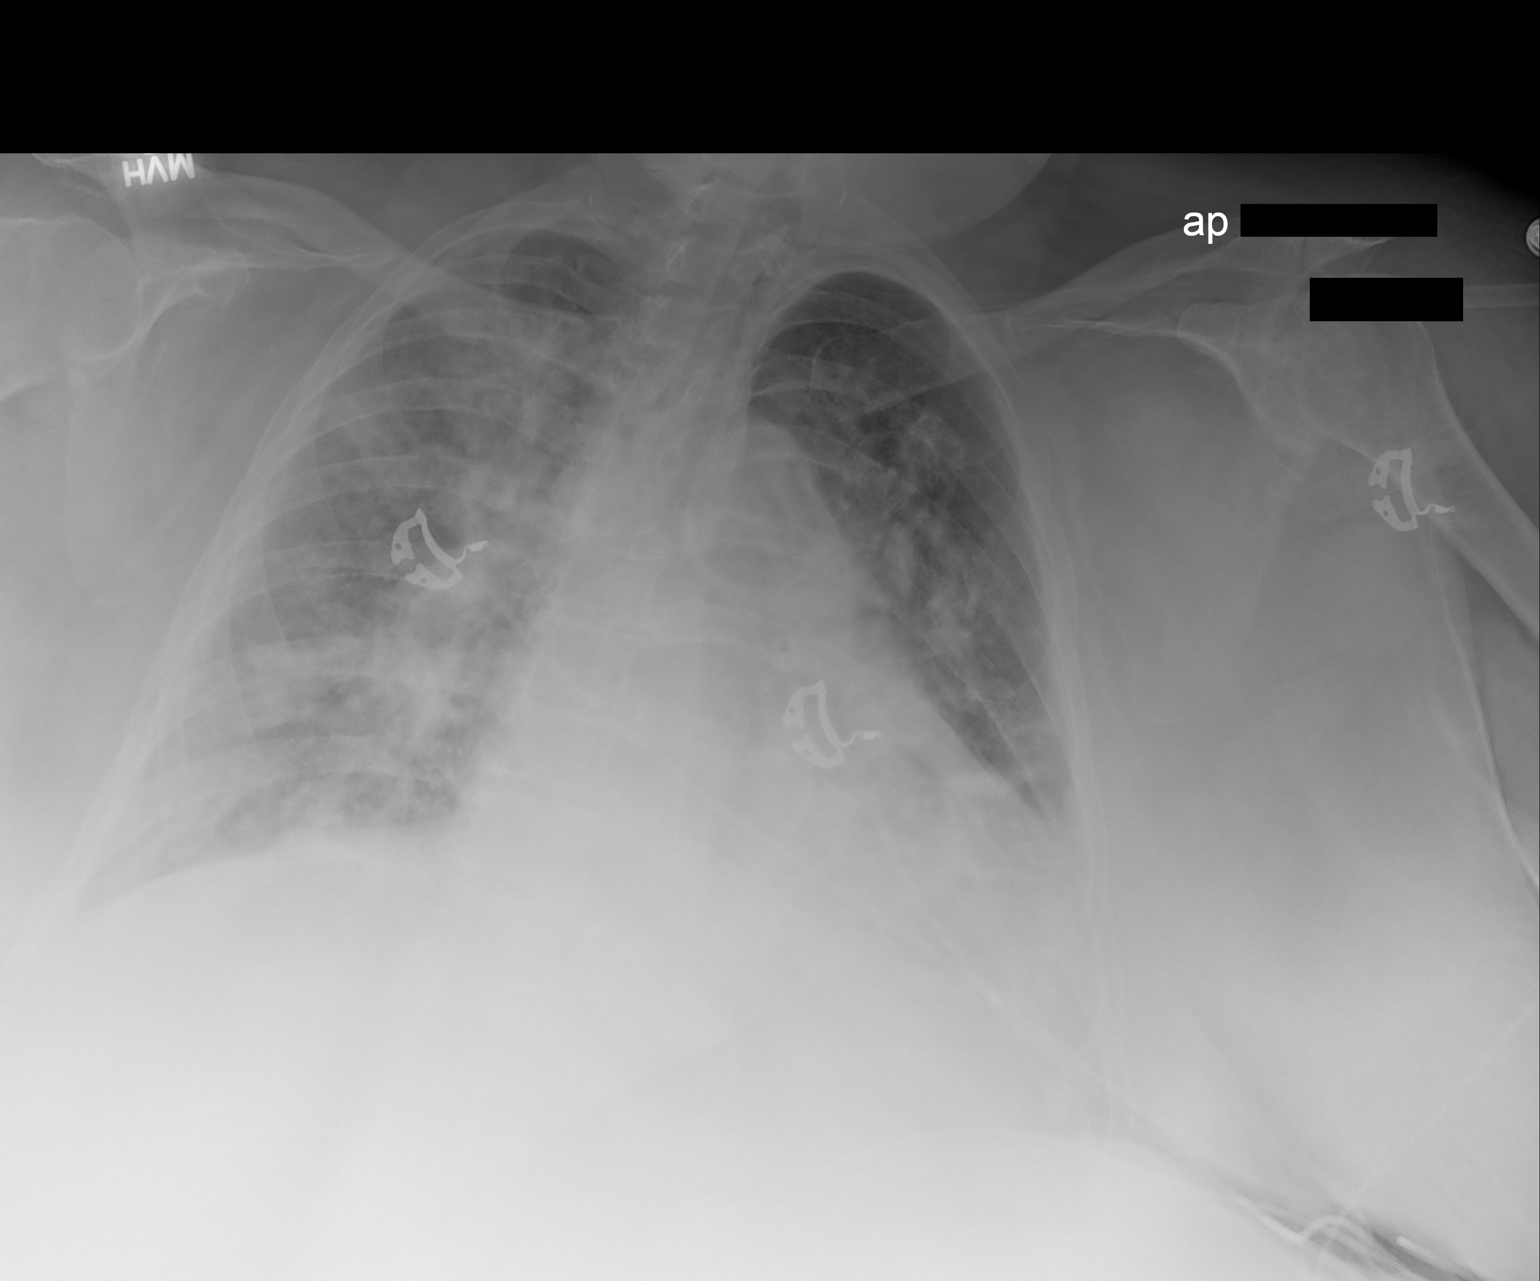

[1 of 1 positions shown; findings below may reference images not displayed]

FINDINGS: Patchy hazy perihilar airspace opacities are slightly increased
since previously. Cardiomegaly reidentified. Lungs are hypoaerated
with crowding of the bronchovascular markings. Trace pleural
effusions.
IMPRESSION: Increased patchy pulmonary parenchymal airspace opacities which are
nonspecific but could indicate worsening edema in the setting of
cardiomegaly. Infection or ARDS could appear similar.

Trace pleural effusions.
